# Patient Record
Sex: Female | Born: 1965 | Race: Black or African American | Hispanic: No | Smoking: Former smoker
Health system: Southern US, Community
[De-identification: ages and names within clinical notes are randomized; demographics above are authoritative.]

## PROBLEM LIST (undated history)

## (undated) DIAGNOSIS — C801 Malignant (primary) neoplasm, unspecified: Secondary | ICD-10-CM

## (undated) DIAGNOSIS — D649 Anemia, unspecified: Secondary | ICD-10-CM

## (undated) DIAGNOSIS — R51 Headache: Secondary | ICD-10-CM

## (undated) DIAGNOSIS — R1900 Intra-abdominal and pelvic swelling, mass and lump, unspecified site: Secondary | ICD-10-CM

## (undated) HISTORY — DX: Intra-abdominal and pelvic swelling, mass and lump, unspecified site: R19.00

---

## 1996-08-13 HISTORY — PX: ABDOMINAL HYSTERECTOMY: SHX81

## 2001-03-25 ENCOUNTER — Emergency Department (HOSPITAL_COMMUNITY): Admission: EM | Admit: 2001-03-25 | Discharge: 2001-03-26 | Payer: Self-pay | Admitting: Emergency Medicine

## 2001-11-25 ENCOUNTER — Emergency Department (HOSPITAL_COMMUNITY): Admission: EM | Admit: 2001-11-25 | Discharge: 2001-11-25 | Payer: Self-pay | Admitting: Emergency Medicine

## 2006-05-28 ENCOUNTER — Emergency Department (HOSPITAL_COMMUNITY): Admission: EM | Admit: 2006-05-28 | Discharge: 2006-05-28 | Payer: Self-pay | Admitting: Emergency Medicine

## 2008-09-24 ENCOUNTER — Emergency Department (HOSPITAL_COMMUNITY): Admission: EM | Admit: 2008-09-24 | Discharge: 2008-09-24 | Payer: Self-pay | Admitting: Emergency Medicine

## 2011-01-28 ENCOUNTER — Emergency Department (HOSPITAL_COMMUNITY)
Admission: EM | Admit: 2011-01-28 | Discharge: 2011-01-28 | Disposition: A | Discharge status: Home / Self Care | Payer: Self-pay | Attending: Emergency Medicine | Admitting: Emergency Medicine

## 2011-01-28 DIAGNOSIS — D573 Sickle-cell trait: Secondary | ICD-10-CM | POA: Insufficient documentation

## 2011-01-28 DIAGNOSIS — H538 Other visual disturbances: Secondary | ICD-10-CM | POA: Insufficient documentation

## 2011-01-28 DIAGNOSIS — T1590XA Foreign body on external eye, part unspecified, unspecified eye, initial encounter: Secondary | ICD-10-CM | POA: Insufficient documentation

## 2011-01-28 DIAGNOSIS — H5710 Ocular pain, unspecified eye: Secondary | ICD-10-CM | POA: Insufficient documentation

## 2011-01-28 DIAGNOSIS — S058X9A Other injuries of unspecified eye and orbit, initial encounter: Secondary | ICD-10-CM | POA: Insufficient documentation

## 2012-11-16 ENCOUNTER — Emergency Department (HOSPITAL_COMMUNITY)
Admission: EM | Admit: 2012-11-16 | Discharge: 2012-11-16 | Disposition: A | Discharge status: Home / Self Care | Payer: Self-pay | Attending: Emergency Medicine | Admitting: Emergency Medicine

## 2012-11-16 ENCOUNTER — Encounter (HOSPITAL_COMMUNITY): Payer: Self-pay | Admitting: *Deleted

## 2012-11-16 ENCOUNTER — Emergency Department (HOSPITAL_COMMUNITY): Payer: Self-pay

## 2012-11-16 DIAGNOSIS — R110 Nausea: Secondary | ICD-10-CM | POA: Insufficient documentation

## 2012-11-16 DIAGNOSIS — N83209 Unspecified ovarian cyst, unspecified side: Secondary | ICD-10-CM | POA: Insufficient documentation

## 2012-11-16 DIAGNOSIS — R142 Eructation: Secondary | ICD-10-CM | POA: Insufficient documentation

## 2012-11-16 DIAGNOSIS — Z3202 Encounter for pregnancy test, result negative: Secondary | ICD-10-CM | POA: Insufficient documentation

## 2012-11-16 DIAGNOSIS — Z9889 Other specified postprocedural states: Secondary | ICD-10-CM | POA: Insufficient documentation

## 2012-11-16 DIAGNOSIS — R1084 Generalized abdominal pain: Secondary | ICD-10-CM | POA: Insufficient documentation

## 2012-11-16 DIAGNOSIS — R141 Gas pain: Secondary | ICD-10-CM | POA: Insufficient documentation

## 2012-11-16 DIAGNOSIS — R109 Unspecified abdominal pain: Secondary | ICD-10-CM

## 2012-11-16 DIAGNOSIS — Z862 Personal history of diseases of the blood and blood-forming organs and certain disorders involving the immune mechanism: Secondary | ICD-10-CM | POA: Insufficient documentation

## 2012-11-16 DIAGNOSIS — Z90710 Acquired absence of both cervix and uterus: Secondary | ICD-10-CM | POA: Insufficient documentation

## 2012-11-16 HISTORY — DX: Anemia, unspecified: D64.9

## 2012-11-16 LAB — CBC WITH DIFFERENTIAL/PLATELET
Basophils Absolute: 0 10*3/uL (ref 0.0–0.1)
Basophils Relative: 0 % (ref 0–1)
HCT: 40 % (ref 36.0–46.0)
Hemoglobin: 14.5 g/dL (ref 12.0–15.0)
Lymphocytes Relative: 21 % (ref 12–46)
Monocytes Absolute: 0.8 10*3/uL (ref 0.1–1.0)
Monocytes Relative: 9 % (ref 3–12)
Neutro Abs: 5.9 10*3/uL (ref 1.7–7.7)
Neutrophils Relative %: 69 % (ref 43–77)
WBC: 8.5 10*3/uL (ref 4.0–10.5)

## 2012-11-16 LAB — COMPREHENSIVE METABOLIC PANEL
AST: 22 U/L (ref 0–37)
Albumin: 3.4 g/dL — ABNORMAL LOW (ref 3.5–5.2)
Alkaline Phosphatase: 71 U/L (ref 39–117)
BUN: 13 mg/dL (ref 6–23)
CO2: 23 mEq/L (ref 19–32)
Chloride: 98 mEq/L (ref 96–112)
Creatinine, Ser: 0.68 mg/dL (ref 0.50–1.10)
GFR calc non Af Amer: >90 mL/min (ref >90)
Potassium: 3.8 mEq/L (ref 3.5–5.1)
Total Bilirubin: 0.6 mg/dL (ref 0.3–1.2)

## 2012-11-16 LAB — URINALYSIS, ROUTINE W REFLEX MICROSCOPIC
Glucose, UA: NEGATIVE mg/dL
Ketones, ur: >80 mg/dL — AB
Protein, ur: 30 mg/dL — AB
pH: 5.5 (ref 5.0–8.0)

## 2012-11-16 LAB — URINE MICROSCOPIC-ADD ON

## 2012-11-16 MED ORDER — OXYCODONE-ACETAMINOPHEN 5-325 MG PO TABS: 1.0000 | ORAL_TABLET | ORAL | Status: DC | PRN

## 2012-11-16 MED ORDER — OXYCODONE-ACETAMINOPHEN 5-325 MG PO TABS
1.0000 | ORAL_TABLET | Freq: Once | ORAL | Status: AC
  Administered 2012-11-16: 1 via ORAL
  Filled 2012-11-16: qty 1

## 2012-11-16 MED ORDER — ONDANSETRON HCL 4 MG/2ML IJ SOLN
4.0000 mg | Freq: Once | INTRAMUSCULAR | Status: AC
  Administered 2012-11-16: 4 mg via INTRAVENOUS
  Filled 2012-11-16: qty 2

## 2012-11-16 MED ORDER — SODIUM CHLORIDE 0.9 % IV SOLN
1000.0000 mL | Freq: Once | INTRAVENOUS | Status: AC
  Administered 2012-11-16: 1000 mL via INTRAVENOUS

## 2012-11-16 MED ORDER — SODIUM CHLORIDE 0.9 % IV SOLN
1000.0000 mL | INTRAVENOUS | Status: DC
  Administered 2012-11-16: 1000 mL via INTRAVENOUS

## 2012-11-16 MED ORDER — IOHEXOL 300 MG/ML  SOLN
100.0000 mL | Freq: Once | INTRAMUSCULAR | Status: AC | PRN
  Administered 2012-11-16: 100 mL via INTRAVENOUS

## 2012-11-16 MED ORDER — MORPHINE SULFATE 4 MG/ML IJ SOLN
4.0000 mg | Freq: Once | INTRAMUSCULAR | Status: AC
  Administered 2012-11-16: 4 mg via INTRAVENOUS
  Filled 2012-11-16: qty 1

## 2012-11-16 MED ORDER — PROMETHAZINE HCL 25 MG PO TABS: 25.0000 mg | ORAL_TABLET | Freq: Four times a day (QID) | ORAL | Status: DC | PRN

## 2012-11-16 MED ORDER — IOHEXOL 300 MG/ML  SOLN
50.0000 mL | Freq: Once | INTRAMUSCULAR | Status: AC | PRN
  Administered 2012-11-16: 50 mL via ORAL

## 2012-11-16 MED ORDER — CEPHALEXIN 500 MG PO CAPS: 500.0000 mg | ORAL_CAPSULE | Freq: Four times a day (QID) | ORAL | Status: DC

## 2012-11-16 MED ORDER — MORPHINE SULFATE 4 MG/ML IJ SOLN
6.0000 mg | Freq: Once | INTRAMUSCULAR | Status: AC
  Administered 2012-11-16: 6 mg via INTRAVENOUS
  Filled 2012-11-16: qty 2

## 2012-11-16 MED ORDER — CEPHALEXIN 250 MG PO CAPS
500.0000 mg | ORAL_CAPSULE | Freq: Once | ORAL | Status: AC
  Administered 2012-11-16: 500 mg via ORAL
  Filled 2012-11-16: qty 2

## 2012-11-16 MED ORDER — METRONIDAZOLE 500 MG PO TABS
2000.0000 mg | ORAL_TABLET | Freq: Once | ORAL | Status: AC
  Administered 2012-11-16: 2000 mg via ORAL
  Filled 2012-11-16: qty 4

## 2012-11-16 NOTE — ED Notes (Signed)
Pt knows we need a urine sample 

## 2012-11-16 NOTE — ED Provider Notes (Signed)
History     CSN: [id number]  Arrival date & time 11/16/12  1252   First MD Initiated Contact with Patient 11/16/12 1305      Chief Complaint  Patient presents with  . Abdominal Pain     HPI Patient reports developing generalized abdominal pain with some abdominal swelling over the past several days.  She's had nausea without vomiting.  Initially she had diarrhea but that is resolved.  She's still moving flatus.  She has a history of C-sections before in the past.  No other abdominal surgery.  No history of bowel obstruction.  No fevers or chills.  No melena or hematochezia.  Her pain is mild to moderate at this generalized.  Decreased oral intake over the past 24 hours.  She's had a partial hysterectomy but still has her ovaries.  No recent weight change or weight gain.  No night sweats.  No pain with urination or frequency of urination.  No rash.  No other complaints the   Past Medical History  Diagnosis Date  . Anemia     Past Surgical History  Procedure Laterality Date  . Abdominal hysterectomy      History reviewed. No pertinent family history.  History  Substance Use Topics  . Smoking status: Not on file  . Smokeless tobacco: Not on file  . Alcohol Use: No    OB History   Grav Para Term Preterm Abortions TAB SAB Ect Mult Living                  Review of Systems  All other systems reviewed and are negative.    Allergies  Review of patient's allergies indicates not on file.  Home Medications  No current outpatient prescriptions on file.  BP 119/53  Pulse 88  Temp(Src) 98.7 F (37.1 C) (Oral)  Resp 16  SpO2 100%  Physical Exam  Nursing note and vitals reviewed. Constitutional: She is oriented to person, place, and time. She appears well-developed and well-nourished. No distress.  HENT:  Head: Normocephalic and atraumatic.  Eyes: EOM are normal.  Neck: Normal range of motion.  Cardiovascular: Normal rate, regular rhythm and normal heart sounds.    Pulmonary/Chest: Effort normal and breath sounds normal.  Abdominal: Soft. She exhibits distension. There is no tenderness. There is no rebound and no guarding.  No masses palpated  Musculoskeletal: Normal range of motion.  Neurological: She is alert and oriented to person, place, and time.  Skin: Skin is warm and dry.  Psychiatric: She has a normal mood and affect. Judgment normal.    ED Course  Procedures (including critical care time)  Labs Reviewed  CBC WITH DIFFERENTIAL - Abnormal; Notable for the following:    MCHC 36.3 (*)    All other components within normal limits  COMPREHENSIVE METABOLIC PANEL - Abnormal; Notable for the following:    Sodium 134 (*)    Total Protein 8.4 (*)    Albumin 3.4 (*)    All other components within normal limits  URINALYSIS, ROUTINE W REFLEX MICROSCOPIC - Abnormal; Notable for the following:    Color, Urine AMBER (*)    APPearance CLOUDY (*)    Hgb urine dipstick LARGE (*)    Bilirubin Urine SMALL (*)    Ketones, ur >80 (*)    Protein, ur 30 (*)    Nitrite POSITIVE (*)    Leukocytes, UA SMALL (*)    All other components within normal limits  URINE MICROSCOPIC-ADD ON - Abnormal; Notable  for the following:    Squamous Epithelial / LPF FEW (*)    Bacteria, UA MANY (*)    All other components within normal limits  URINE CULTURE  LIPASE, BLOOD  POCT PREGNANCY, URINE   Ct Abdomen Pelvis W Contrast  11/16/2012  *RADIOLOGY REPORT*  Clinical Data: Abdominal pain, nausea, distention and diarrhea.  CT ABDOMEN AND PELVIS WITH CONTRAST  Technique:  Multidetector CT imaging of the abdomen and pelvis was performed following the standard protocol during bolus administration of intravenous contrast.  Contrast: OMNIPAQUE IOHEXOL 300 MG/ML SOLN  Comparison: None.  Findings: There is a large complex cystic lesion of the left pelvis measuring approximately 7.2 x 10.5 x 9.3 cm.  This contains a single dominant anterior and superior septation.  Internal  fluid density is 21 HU and consistent with complex fluid.  The lesion appears largely cystic without a significant solid enhancing component.  Gynecologic referral is recommended as this is highly suspicious for a cystic ovarian neoplasm.  Correlation also suggested with previous gynecologic history as it appears that the uterus has been previously removed.  No ascites or evidence of overt carcinomatosis.  Bowel loops are normal caliber.  The liver, gallbladder, pancreas, spleen, adrenal glands and kidneys are unremarkable.  No bony abnormalities is seen.  IMPRESSION: Large complex cystic lesion of the left pelvis which likely represents a cystic ovarian process.  This would have to be considered malignant until proven otherwise.  Gynecologic referral is recommended.   Original Report Authenticated By: Irish Lack, M.D.    I personally reviewed the imaging tests through PACS system I reviewed available ER/hospitalization records through the EMR   1. Abdominal pain   2. Ovarian cystic mass       MDM  Patient's CT scan is concerning for a large ovarian cyst/ovarian cystic mass.  This is concerning for ovarian cancer.  The patient understands the importance of gynecologic followup.  She was personally shown the images in her room.  [facility] for followup.  She understands the potential for cancer.  Discharge home in good condition.  No evidence of bowel obstruction or other abnormalities.  She understands to return to the ER for new or worsening symptoms.  Likely urinary tract infection.  This is true of Keflex.  Home with Keflex.  Also with Trichomonas.  2 g of Flagyl here.  She understands importance of having her partner seen and evaluated and treated at the health department or with his primary care physician       Lyanne Co, MD 11/16/12 [phone]

## 2012-11-16 NOTE — ED Notes (Signed)
Pt reports onset Friday evening of generalized abd pain and distention. Having nausea. Reports initially having diarrhea but it has stopped.

## 2012-11-18 LAB — URINE CULTURE

## 2012-11-19 ENCOUNTER — Telehealth (HOSPITAL_COMMUNITY): Payer: Self-pay | Admitting: Emergency Medicine

## 2012-11-19 NOTE — ED Notes (Signed)
+  Urine. Patient treated with Keflex. Sensitive to same. Per protocol MD. °

## 2012-11-19 NOTE — ED Notes (Signed)
Patient has +Urine culture. °

## 2012-12-10 ENCOUNTER — Ambulatory Visit: Payer: PRIVATE HEALTH INSURANCE | Attending: Gynecologic Oncology | Admitting: Gynecologic Oncology

## 2012-12-16 ENCOUNTER — Ambulatory Visit: Payer: PRIVATE HEALTH INSURANCE | Attending: Gynecology | Admitting: Gynecology

## 2012-12-16 ENCOUNTER — Encounter: Payer: Self-pay | Admitting: Gynecology

## 2012-12-16 VITALS — Temp 98.7°F | Ht 62.0 in | Wt 132.7 lb

## 2012-12-16 DIAGNOSIS — M25552 Pain in left hip: Secondary | ICD-10-CM

## 2012-12-16 DIAGNOSIS — R1900 Intra-abdominal and pelvic swelling, mass and lump, unspecified site: Secondary | ICD-10-CM

## 2012-12-16 DIAGNOSIS — M25559 Pain in unspecified hip: Secondary | ICD-10-CM | POA: Insufficient documentation

## 2012-12-16 DIAGNOSIS — N83209 Unspecified ovarian cyst, unspecified side: Secondary | ICD-10-CM | POA: Insufficient documentation

## 2012-12-16 NOTE — Patient Instructions (Signed)
We will schedule surgery to remove the left ovarian cyst on 01/13/2013. Preoperative evaluation be scheduled prior to that time.

## 2012-12-16 NOTE — Progress Notes (Signed)
Consult Note: Gyn-Onc   Susan Osborn 47 y.o. female  Chief Complaint  Patient presents with  . Pelvic Mass    New patient    Assessment : Complex left ovarian cyst with associated pain.  Plan: I recommend the patient undergo exploratory laparotomy through her prior Pfannenstiel incision. A left salpingo-oophorectomy will be performed and submitted for frozen section. If this is benign and the right ovary appears normal we will leave the right ovary intact. On the other hand, if the patient does have ovarian cancer, we will proceed with full surgical staging including lymphadenectomy omentectomy and bilateral salpingo-oophorectomy.  Risks surgery discussed the patient's questions are answered. Surgery will be scheduled for 01/14/2012.  Patient given a prescription for Vicodin for pain. Preoperative evaluation will be scheduled.     HPI: 47 year old African American female seen in consultation of Dr.[name] regarding management of a newly diagnosed pelvic mass. The patient presented to the emergency room on 11/16/2012 with pain. A CT scan was obtained showing a 7.2 x 10.5 x 9.3 cm large complex left ovarian mass. No evidence of adenopathy or ascites or peritoneal thickening.  Patient continued to have pain but is extreme. She denies any other GI or GU symptoms.  Obstetrical history: Gravida 3. All deliveries were by C-section.  Patient's had no other gynecologic history. She denies any abnormal Pap smears.  She did have a hysterectomy in 1999 for abnormal uterine bleeding.  Review of Systems:10 point review of systems is negative except as noted in interval history.   Vitals: Temperature 98.7 F (37.1 C), height 5\' 2"  (1.575 m), weight 132 lb 11.2 oz (60.192 kg).  Physical Exam: General : The patient is a healthy woman in no acute distress.  HEENT: normocephalic, extraoccular movements normal; neck is supple without thyromegally  Lynphnodes: Supraclavicular and  inguinal nodes not enlarged  Abdomen: Soft, non-tender, no ascites, no organomegally, no masses, no hernias, well-healed Pfannenstiel incision  Pelvic:  EGBUS: Normal female  Vagina: Normal, no lesions  Urethra and Bladder: Normal, non-tender  Cervix: Surgically absent  Uterus: Surgically absent  Bi-manual examination: Non-tender; there is fullness in the left adnexa. This is nontender. Rectal: normal sphincter tone, no masses, no blood  Lower extremities: No edema or varicosities. Normal range of motion      No Known Allergies  Past Medical History  Diagnosis Date  . Anemia   . Pelvic mass in female     Past Surgical History  Procedure Laterality Date  . Cesarean section      x3  . Abdominal hysterectomy  1998    partial    Current Outpatient Prescriptions  Medication Sig Dispense Refill  . Multiple Vitamin (MULTIVITAMIN WITH MINERALS) TABS Take 1 tablet by mouth daily.      Marland Kitchen oxyCODONE-acetaminophen (PERCOCET/ROXICET) 5-325 MG per tablet Take 1 tablet by mouth every 4 (four) hours as needed for pain.  20 tablet  0  . cephALEXin (KEFLEX) 500 MG capsule Take 1 capsule (500 mg total) by mouth 4 (four) times daily.  28 capsule  0  . promethazine (PHENERGAN) 25 MG tablet Take 1 tablet (25 mg total) by mouth every 6 (six) hours as needed for nausea.  15 tablet  0   No current facility-administered medications for this visit.    History   Social History  . Marital Status: Legally Separated    Spouse Name: N/A    Number of Children: N/A  . Years of Education: N/A   Occupational History  .  Not on file.   Social History Main Topics  . Smoking status: Former Smoker    Quit date: 12/12/2010  . Smokeless tobacco: Never Used  . Alcohol Use: Yes     Comment: occas  . Drug Use: No  . Sexually Active: No   Other Topics Concern  . Not on file   Social History Narrative  . No narrative on file    Family History  Problem Relation Age of Onset  . Breast cancer  Mother   . Stomach cancer Mother   . Emphysema Father   . Kidney failure Brother   . Pancreatic cancer Maternal Aunt   . Stomach cancer Paternal Grandfather       Jeannette Corpus, MD 12/16/2012, 2:02 PM

## 2013-01-01 ENCOUNTER — Encounter (HOSPITAL_COMMUNITY): Payer: Self-pay | Admitting: Pharmacy Technician

## 2013-01-08 NOTE — Patient Instructions (Signed)
Susan Osborn  01/08/2013   Your procedure is scheduled on:  01/13/13    Report to [facility] at    1015  AM.  Call this number if you have problems the morning of surgery: [phone]   Remember:   Do not eat food after midnite May have clear liquids until 0600am then npo.    Take these medicines the morning of surgery with A SIP OF WATER:    Do not wear jewelry, make-up or nail polish.  Do not wear lotions, powders, or perfumes. .  Do not shave 48 hours prior to surgery.   Do not bring valuables to the hospital.  Contacts, dentures or bridgework may not be worn into surgery.  Leave suitcase in the car. After surgery it may be brought to your room.  For patients admitted to the hospital, checkout time is 11:00 AM the day of  discharge.    SEE CHG INSTRUCTION SHEET    Please read over the following fact sheets that you were given: MRSA Information, coughing and deep breathing exercises, leg exercises, Blood Transfusion Fact sheet, Incentive Spirometry Fact sheet                Failure to comply with these instructions may result in cancellation of your surgery.                Patient Signature ____________________________              Nurse Signature _____________________________

## 2013-01-09 ENCOUNTER — Encounter (HOSPITAL_COMMUNITY)
Admission: RE | Admit: 2013-01-09 | Discharge: 2013-01-09 | Disposition: A | Discharge status: Home / Self Care | Payer: PRIVATE HEALTH INSURANCE | Source: Ambulatory Visit | Attending: Gynecology | Admitting: Gynecology

## 2013-01-09 ENCOUNTER — Encounter (HOSPITAL_COMMUNITY): Payer: Self-pay

## 2013-01-09 DIAGNOSIS — Z0183 Encounter for blood typing: Secondary | ICD-10-CM | POA: Insufficient documentation

## 2013-01-09 DIAGNOSIS — Z01812 Encounter for preprocedural laboratory examination: Secondary | ICD-10-CM | POA: Insufficient documentation

## 2013-01-09 HISTORY — DX: Headache: R51

## 2013-01-09 LAB — COMPREHENSIVE METABOLIC PANEL
Albumin: 3.4 g/dL — ABNORMAL LOW (ref 3.5–5.2)
Alkaline Phosphatase: 62 U/L (ref 39–117)
BUN: 12 mg/dL (ref 6–23)
CO2: 30 mEq/L (ref 19–32)
Chloride: 102 mEq/L (ref 96–112)
Creatinine, Ser: 0.73 mg/dL (ref 0.50–1.10)
GFR calc Af Amer: >90 mL/min (ref >90)
GFR calc non Af Amer: >90 mL/min (ref >90)
Glucose, Bld: 82 mg/dL (ref 70–99)
Potassium: 4.4 mEq/L (ref 3.5–5.1)
Total Bilirubin: 0.2 mg/dL — ABNORMAL LOW (ref 0.3–1.2)

## 2013-01-09 LAB — CBC WITH DIFFERENTIAL/PLATELET
Basophils Relative: 0 % (ref 0–1)
HCT: 40.5 % (ref 36.0–46.0)
Hemoglobin: 13.7 g/dL (ref 12.0–15.0)
Lymphs Abs: 1.6 10*3/uL (ref 0.7–4.0)
MCH: 31.4 pg (ref 26.0–34.0)
MCHC: 33.8 g/dL (ref 30.0–36.0)
Monocytes Absolute: 0.3 10*3/uL (ref 0.1–1.0)
Monocytes Relative: 6 % (ref 3–12)
Neutro Abs: 2.3 10*3/uL (ref 1.7–7.7)
Neutrophils Relative %: 55 % (ref 43–77)
RBC: 4.36 MIL/uL (ref 3.87–5.11)

## 2013-01-09 LAB — SURGICAL PCR SCREEN: Staphylococcus aureus: NEGATIVE

## 2013-01-09 NOTE — Progress Notes (Signed)
Patient seen by Karena Addison in Pathology Research then patient to Oncology GYN office regarding bowel prep.  Melissa in office aware patient was coming by office regarding bowel prep after preop appointment.

## 2013-01-12 ENCOUNTER — Telehealth: Payer: Self-pay | Admitting: Gynecologic Oncology

## 2013-01-12 NOTE — Telephone Encounter (Signed)
Called to check on patient's pre-op status.  No answer and unable to leave a voicemail message.

## 2013-01-12 NOTE — Telephone Encounter (Signed)
Telephone call to check on pre-operative status.  No answer and unable to leave message since mailbox was full.  Spoke with her emergency contact, Howell Pringle, and she stated that her mother lost her phone and there was no other number to contact her at.  Stating that her mother understood the pre-op instructions and had began her bowel prep earlier.  Instructed to call the office with any questions or concerns.

## 2013-01-13 ENCOUNTER — Inpatient Hospital Stay (HOSPITAL_COMMUNITY)
Admission: RE | Admit: 2013-01-13 | Discharge: 2013-01-17 | DRG: 742 | Disposition: A | Discharge status: Home / Self Care | Payer: PRIVATE HEALTH INSURANCE | Source: Ambulatory Visit | Attending: Obstetrics & Gynecology | Admitting: Obstetrics & Gynecology

## 2013-01-13 ENCOUNTER — Encounter (HOSPITAL_COMMUNITY): Payer: Self-pay | Admitting: Anesthesiology

## 2013-01-13 ENCOUNTER — Encounter (HOSPITAL_COMMUNITY)
Admission: RE | Disposition: A | Discharge status: Home / Self Care | Payer: Self-pay | Source: Ambulatory Visit | Attending: Obstetrics & Gynecology

## 2013-01-13 ENCOUNTER — Encounter (HOSPITAL_COMMUNITY): Payer: Self-pay | Admitting: *Deleted

## 2013-01-13 ENCOUNTER — Ambulatory Visit (HOSPITAL_COMMUNITY): Payer: PRIVATE HEALTH INSURANCE | Admitting: Anesthesiology

## 2013-01-13 DIAGNOSIS — N80129 Deep endometriosis of ovary, unspecified ovary: Secondary | ICD-10-CM | POA: Diagnosis present

## 2013-01-13 DIAGNOSIS — N736 Female pelvic peritoneal adhesions (postinfective): Secondary | ICD-10-CM | POA: Diagnosis present

## 2013-01-13 DIAGNOSIS — D62 Acute posthemorrhagic anemia: Secondary | ICD-10-CM | POA: Diagnosis not present

## 2013-01-13 DIAGNOSIS — N801 Endometriosis of ovary: Secondary | ICD-10-CM | POA: Diagnosis present

## 2013-01-13 DIAGNOSIS — N135 Crossing vessel and stricture of ureter without hydronephrosis: Secondary | ICD-10-CM | POA: Diagnosis present

## 2013-01-13 DIAGNOSIS — N83209 Unspecified ovarian cyst, unspecified side: Principal | ICD-10-CM

## 2013-01-13 DIAGNOSIS — Z87891 Personal history of nicotine dependence: Secondary | ICD-10-CM

## 2013-01-13 DIAGNOSIS — N80109 Endometriosis of ovary, unspecified side, unspecified depth: Secondary | ICD-10-CM | POA: Diagnosis present

## 2013-01-13 DIAGNOSIS — D201 Benign neoplasm of soft tissue of peritoneum: Secondary | ICD-10-CM

## 2013-01-13 DIAGNOSIS — D200 Benign neoplasm of soft tissue of retroperitoneum: Secondary | ICD-10-CM

## 2013-01-13 HISTORY — PX: LAPAROTOMY: SHX154

## 2013-01-13 LAB — TYPE AND SCREEN
ABO/RH(D): O POS
Antibody Screen: NEGATIVE

## 2013-01-13 SURGERY — LAPAROTOMY, EXPLORATORY
Anesthesia: General | Laterality: Left | Wound class: Clean Contaminated

## 2013-01-13 MED ORDER — 0.9 % SODIUM CHLORIDE (POUR BTL) OPTIME
TOPICAL | Status: DC | PRN
  Administered 2013-01-13: 2000 mL

## 2013-01-13 MED ORDER — MIDAZOLAM HCL 5 MG/5ML IJ SOLN
INTRAMUSCULAR | Status: DC | PRN
  Administered 2013-01-13: 2 mg via INTRAVENOUS

## 2013-01-13 MED ORDER — HYDROMORPHONE HCL PF 1 MG/ML IJ SOLN
INTRAMUSCULAR | Status: AC
  Filled 2013-01-13: qty 2

## 2013-01-13 MED ORDER — SODIUM CHLORIDE 0.9 % IJ SOLN
INTRAMUSCULAR | Status: DC | PRN
  Administered 2013-01-13: 13:00:00

## 2013-01-13 MED ORDER — ONDANSETRON HCL 4 MG/2ML IJ SOLN
4.0000 mg | Freq: Four times a day (QID) | INTRAMUSCULAR | Status: DC | PRN
  Administered 2013-01-13 – 2013-01-15 (×4): 4 mg via INTRAVENOUS
  Filled 2013-01-13 (×6): qty 2

## 2013-01-13 MED ORDER — CEFAZOLIN SODIUM-DEXTROSE 2-3 GM-% IV SOLR
2.0000 g | INTRAVENOUS | Status: AC
  Administered 2013-01-13: 2 g via INTRAVENOUS

## 2013-01-13 MED ORDER — FENTANYL CITRATE 0.05 MG/ML IJ SOLN
25.0000 ug | INTRAMUSCULAR | Status: DC | PRN
  Administered 2013-01-13 (×3): 50 ug via INTRAVENOUS

## 2013-01-13 MED ORDER — LACTATED RINGERS IV SOLN: INTRAVENOUS | Status: DC

## 2013-01-13 MED ORDER — KCL IN DEXTROSE-NACL 20-5-0.45 MEQ/L-%-% IV SOLN
INTRAVENOUS | Status: DC
  Administered 2013-01-13 – 2013-01-14 (×3): via INTRAVENOUS
  Filled 2013-01-13 (×3): qty 1000

## 2013-01-13 MED ORDER — ROCURONIUM BROMIDE 100 MG/10ML IV SOLN
INTRAVENOUS | Status: DC | PRN
  Administered 2013-01-13: 10 mg via INTRAVENOUS
  Administered 2013-01-13: 40 mg via INTRAVENOUS
  Administered 2013-01-13: 10 mg via INTRAVENOUS

## 2013-01-13 MED ORDER — FENTANYL CITRATE 0.05 MG/ML IJ SOLN
INTRAMUSCULAR | Status: AC
  Filled 2013-01-13: qty 2

## 2013-01-13 MED ORDER — HYDROMORPHONE HCL PF 1 MG/ML IJ SOLN: INTRAMUSCULAR | Status: DC | PRN

## 2013-01-13 MED ORDER — KETOROLAC TROMETHAMINE 30 MG/ML IJ SOLN
INTRAMUSCULAR | Status: AC
  Filled 2013-01-13: qty 1

## 2013-01-13 MED ORDER — ONDANSETRON HCL 4 MG PO TABS: 4.0000 mg | ORAL_TABLET | Freq: Four times a day (QID) | ORAL | Status: DC | PRN

## 2013-01-13 MED ORDER — OXYCODONE-ACETAMINOPHEN 5-325 MG PO TABS
1.0000 | ORAL_TABLET | ORAL | Status: DC | PRN
  Administered 2013-01-13: 1 via ORAL
  Administered 2013-01-14 – 2013-01-17 (×7): 2 via ORAL
  Filled 2013-01-13 (×9): qty 2
  Filled 2013-01-13: qty 1

## 2013-01-13 MED ORDER — KETOROLAC TROMETHAMINE 30 MG/ML IJ SOLN
30.0000 mg | Freq: Four times a day (QID) | INTRAMUSCULAR | Status: DC
  Administered 2013-01-13 – 2013-01-17 (×16): 30 mg via INTRAVENOUS
  Filled 2013-01-13 (×22): qty 1

## 2013-01-13 MED ORDER — HYDROMORPHONE HCL PF 1 MG/ML IJ SOLN
0.5000 mg | INTRAMUSCULAR | Status: DC | PRN
  Administered 2013-01-13 – 2013-01-14 (×6): 0.5 mg via INTRAVENOUS
  Filled 2013-01-13 (×6): qty 1

## 2013-01-13 MED ORDER — HYDROMORPHONE HCL PF 1 MG/ML IJ SOLN
INTRAMUSCULAR | Status: AC
  Filled 2013-01-13: qty 1

## 2013-01-13 MED ORDER — LACTATED RINGERS IV SOLN
INTRAVENOUS | Status: DC | PRN
  Administered 2013-01-13: 16:00:00
  Administered 2013-01-13 (×2): via INTRAVENOUS

## 2013-01-13 MED ORDER — PROPOFOL 10 MG/ML IV BOLUS
INTRAVENOUS | Status: DC | PRN
  Administered 2013-01-13: 150 mg via INTRAVENOUS

## 2013-01-13 MED ORDER — HYDROMORPHONE HCL PF 1 MG/ML IJ SOLN
INTRAMUSCULAR | Status: DC | PRN
  Administered 2013-01-13: 1 mg via INTRAVENOUS
  Administered 2013-01-13 (×2): 0.5 mg via INTRAVENOUS

## 2013-01-13 MED ORDER — KETOROLAC TROMETHAMINE 30 MG/ML IJ SOLN
30.0000 mg | Freq: Four times a day (QID) | INTRAMUSCULAR | Status: DC
  Filled 2013-01-13 (×16): qty 1

## 2013-01-13 MED ORDER — HYDROMORPHONE HCL PF 1 MG/ML IJ SOLN: 0.5000 mg | INTRAMUSCULAR | Status: DC | PRN

## 2013-01-13 MED ORDER — GLYCOPYRROLATE 0.2 MG/ML IJ SOLN
INTRAMUSCULAR | Status: DC | PRN
  Administered 2013-01-13: 0.4 mg via INTRAVENOUS

## 2013-01-13 MED ORDER — CEFAZOLIN SODIUM-DEXTROSE 2-3 GM-% IV SOLR
INTRAVENOUS | Status: AC
  Filled 2013-01-13: qty 50

## 2013-01-13 MED ORDER — BUPIVACAINE LIPOSOME 1.3 % IJ SUSP
20.0000 mL | Freq: Once | INTRAMUSCULAR | Status: DC
  Filled 2013-01-13: qty 20

## 2013-01-13 MED ORDER — ONDANSETRON HCL 4 MG/2ML IJ SOLN
INTRAMUSCULAR | Status: DC | PRN
  Administered 2013-01-13: 4 mg via INTRAVENOUS

## 2013-01-13 MED ORDER — LIDOCAINE HCL (CARDIAC) 20 MG/ML IV SOLN
INTRAVENOUS | Status: DC | PRN
  Administered 2013-01-13: 40 mg via INTRAVENOUS
  Administered 2013-01-13: 60 mg via INTRAVENOUS

## 2013-01-13 MED ORDER — HYDROMORPHONE HCL PF 1 MG/ML IJ SOLN
0.2500 mg | INTRAMUSCULAR | Status: DC | PRN
  Administered 2013-01-13 (×7): 0.5 mg via INTRAVENOUS

## 2013-01-13 MED ORDER — FENTANYL CITRATE 0.05 MG/ML IJ SOLN: 25.0000 ug | INTRAMUSCULAR | Status: DC | PRN

## 2013-01-13 MED ORDER — FENTANYL CITRATE 0.05 MG/ML IJ SOLN
INTRAMUSCULAR | Status: DC | PRN
  Administered 2013-01-13 (×2): 100 ug via INTRAVENOUS
  Administered 2013-01-13: 50 ug via INTRAVENOUS

## 2013-01-13 MED ORDER — MAGNESIUM HYDROXIDE 400 MG/5ML PO SUSP
30.0000 mL | Freq: Three times a day (TID) | ORAL | Status: AC
  Administered 2013-01-13 – 2013-01-14 (×3): 30 mL via ORAL
  Filled 2013-01-13 (×3): qty 30

## 2013-01-13 MED ORDER — NEOSTIGMINE METHYLSULFATE 1 MG/ML IJ SOLN
INTRAMUSCULAR | Status: DC | PRN
  Administered 2013-01-13: 3 mg via INTRAVENOUS

## 2013-01-13 MED ORDER — ZOLPIDEM TARTRATE 5 MG PO TABS: 5.0000 mg | ORAL_TABLET | Freq: Every evening | ORAL | Status: DC | PRN

## 2013-01-13 MED ORDER — PROMETHAZINE HCL 25 MG/ML IJ SOLN: 6.2500 mg | INTRAMUSCULAR | Status: DC | PRN

## 2013-01-13 SURGICAL SUPPLY — 46 items
ATTRACTOMAT 16X20 MAGNETIC DRP (DRAPES) ×2 IMPLANT
BAG URINE DRAINAGE (UROLOGICAL SUPPLIES) ×1 IMPLANT
CANISTER SUCTION 2500CC (MISCELLANEOUS) ×1 IMPLANT
CHLORAPREP W/TINT 26ML (MISCELLANEOUS) ×2 IMPLANT
CLIP TI MEDIUM LARGE 6 (CLIP) ×6 IMPLANT
CLOTH BEACON ORANGE TIMEOUT ST (SAFETY) ×2 IMPLANT
COVER SURGICAL LIGHT HANDLE (MISCELLANEOUS) ×2 IMPLANT
DRAPE UTILITY XL STRL (DRAPES) ×2 IMPLANT
DRAPE WARM FLUID 44X44 (DRAPE) ×2 IMPLANT
DRSG TELFA 4X14 ISLAND ADH (GAUZE/BANDAGES/DRESSINGS) ×1 IMPLANT
ELECT BLADE 6.5 EXT (BLADE) ×2 IMPLANT
ELECT REM PT RETURN 9FT ADLT (ELECTROSURGICAL) ×2
ELECTRODE REM PT RTRN 9FT ADLT (ELECTROSURGICAL) ×1 IMPLANT
GAUZE SPONGE 4X4 16PLY XRAY LF (GAUZE/BANDAGES/DRESSINGS) ×1 IMPLANT
GLOVE BIOGEL M STRL SZ7.5 (GLOVE) ×9 IMPLANT
GOWN PREVENTION PLUS LG XLONG (DISPOSABLE) ×1 IMPLANT
GOWN STRL NON-REIN LRG LVL3 (GOWN DISPOSABLE) ×3 IMPLANT
GOWN STRL REIN XL XLG (GOWN DISPOSABLE) ×3 IMPLANT
LIGASURE IMPACT 36 18CM CVD LR (INSTRUMENTS) IMPLANT
MANIFOLD NEPTUNE II (INSTRUMENTS) ×1 IMPLANT
NS IRRIG 1000ML POUR BTL (IV SOLUTION) ×4 IMPLANT
PACK ABDOMINAL WL (CUSTOM PROCEDURE TRAY) ×2 IMPLANT
SET CYSTO W/LG BORE CLAMP LF (SET/KITS/TRAYS/PACK) ×1 IMPLANT
SHEET LAVH (DRAPES) ×2 IMPLANT
SPONGE GAUZE 4X4 12PLY (GAUZE/BANDAGES/DRESSINGS) ×2 IMPLANT
SPONGE LAP 18X18 X RAY DECT (DISPOSABLE) ×2 IMPLANT
STAPLER SKIN PROX WIDE 3.9 (STAPLE) ×2 IMPLANT
STAPLER VISISTAT 35W (STAPLE) ×1 IMPLANT
SUT ETHILON 1 LR 30 (SUTURE) IMPLANT
SUT PDS AB 1 CTXB1 36 (SUTURE) ×5 IMPLANT
SUT SILK 2 0 (SUTURE)
SUT SILK 2 0 30  PSL (SUTURE)
SUT SILK 2 0 30 PSL (SUTURE) IMPLANT
SUT SILK 2-0 18XBRD TIE 12 (SUTURE) ×1 IMPLANT
SUT VIC AB 0 CT1 36 (SUTURE) ×4 IMPLANT
SUT VIC AB 2-0 CT2 27 (SUTURE) ×8 IMPLANT
SUT VIC AB 2-0 SH 27 (SUTURE) ×4
SUT VIC AB 2-0 SH 27X BRD (SUTURE) ×6 IMPLANT
SUT VIC AB 3-0 CTX 36 (SUTURE) IMPLANT
SUT VICRYL 2 0 18  UND BR (SUTURE) ×1
SUT VICRYL 2 0 18 UND BR (SUTURE) ×1 IMPLANT
TAPE CLOTH SURG 4X10 WHT LF (GAUZE/BANDAGES/DRESSINGS) ×1 IMPLANT
TOWEL OR 17X26 10 PK STRL BLUE (TOWEL DISPOSABLE) ×2 IMPLANT
TOWEL OR NON WOVEN STRL DISP B (DISPOSABLE) ×2 IMPLANT
TRAY FOLEY CATH 14FRSI W/METER (CATHETERS) ×2 IMPLANT
WATER STERILE IRR 1500ML POUR (IV SOLUTION) ×1 IMPLANT

## 2013-01-13 NOTE — Interval H&P Note (Signed)
History and Physical Interval Note:  01/13/2013 11:44 AM  Susan Osborn  has presented today for surgery, with the diagnosis of PELVIC MASS   The various methods of treatment have been discussed with the patient and family. After consideration of risks, benefits and other options for treatment, the patient has consented to  Procedure(s): EXPLORATORY LAPAROTOMY LEFT SALPINGO OOPHORECTOMY POSSIBLE BILATERAL SALPINGO OOPHORECTOMY AND STAGING   (Left) as a surgical intervention .  The patient's history has been reviewed, patient examined, no change in status, stable for surgery.  I have reviewed the patient's chart and labs.  Questions were answered to the patient's satisfaction.     CLARKE-PEARSON,Devaun Hernandez L

## 2013-01-13 NOTE — H&P (View-Only) (Signed)
Consult Note: Gyn-Onc   Susan Osborn 47 y.o. female  Chief Complaint  Patient presents with  . Pelvic Mass    New patient    Assessment : Complex left ovarian cyst with associated pain.  Plan: I recommend the patient undergo exploratory laparotomy through her prior Pfannenstiel incision. A left salpingo-oophorectomy will be performed and submitted for frozen section. If this is benign and the right ovary appears normal we will leave the right ovary intact. On the other hand, if the patient does have ovarian cancer, we will proceed with full surgical staging including lymphadenectomy omentectomy and bilateral salpingo-oophorectomy.  Risks surgery discussed the patient's questions are answered. Surgery will be scheduled for 01/14/2012.  Patient given a prescription for Vicodin for pain. Preoperative evaluation will be scheduled.     HPI: 47-year-old African American female seen in consultation of Dr.[name] regarding management of a newly diagnosed pelvic mass. The patient presented to the emergency room on 11/16/2012 with pain. A CT scan was obtained showing a 7.2 x 10.5 x 9.3 cm large complex left ovarian mass. No evidence of adenopathy or ascites or peritoneal thickening.  Patient continued to have pain but is extreme. She denies any other GI or GU symptoms.  Obstetrical history: Gravida 3. All deliveries were by C-section.  Patient's had no other gynecologic history. She denies any abnormal Pap smears.  She did have a hysterectomy in 1999 for abnormal uterine bleeding.  Review of Systems:10 point review of systems is negative except as noted in interval history.   Vitals: Temperature 98.7 F (37.1 C), height 5' 2" (1.575 m), weight 132 lb 11.2 oz (60.192 kg).  Physical Exam: General : The patient is a healthy woman in no acute distress.  HEENT: normocephalic, extraoccular movements normal; neck is supple without thyromegally  Lynphnodes: Supraclavicular and  inguinal nodes not enlarged  Abdomen: Soft, non-tender, no ascites, no organomegally, no masses, no hernias, well-healed Pfannenstiel incision  Pelvic:  EGBUS: Normal female  Vagina: Normal, no lesions  Urethra and Bladder: Normal, non-tender  Cervix: Surgically absent  Uterus: Surgically absent  Bi-manual examination: Non-tender; there is fullness in the left adnexa. This is nontender. Rectal: normal sphincter tone, no masses, no blood  Lower extremities: No edema or varicosities. Normal range of motion      No Known Allergies  Past Medical History  Diagnosis Date  . Anemia   . Pelvic mass in female     Past Surgical History  Procedure Laterality Date  . Cesarean section      x3  . Abdominal hysterectomy  1998    partial    Current Outpatient Prescriptions  Medication Sig Dispense Refill  . Multiple Vitamin (MULTIVITAMIN WITH MINERALS) TABS Take 1 tablet by mouth daily.      . oxyCODONE-acetaminophen (PERCOCET/ROXICET) 5-325 MG per tablet Take 1 tablet by mouth every 4 (four) hours as needed for pain.  20 tablet  0  . cephALEXin (KEFLEX) 500 MG capsule Take 1 capsule (500 mg total) by mouth 4 (four) times daily.  28 capsule  0  . promethazine (PHENERGAN) 25 MG tablet Take 1 tablet (25 mg total) by mouth every 6 (six) hours as needed for nausea.  15 tablet  0   No current facility-administered medications for this visit.    History   Social History  . Marital Status: Legally Separated    Spouse Name: N/A    Number of Children: N/A  . Years of Education: N/A   Occupational History  .   Not on file.   Social History Main Topics  . Smoking status: Former Smoker    Quit date: 12/12/2010  . Smokeless tobacco: Never Used  . Alcohol Use: Yes     Comment: occas  . Drug Use: No  . Sexually Active: No   Other Topics Concern  . Not on file   Social History Narrative  . No narrative on file    Family History  Problem Relation Age of Onset  . Breast cancer  Mother   . Stomach cancer Mother   . Emphysema Father   . Kidney failure Brother   . Pancreatic cancer Maternal Aunt   . Stomach cancer Paternal Grandfather       CLARKE-PEARSON,Kastiel Simonian L, MD 12/16/2012, 2:02 PM        

## 2013-01-13 NOTE — Anesthesia Preprocedure Evaluation (Addendum)
Anesthesia Evaluation  Patient identified by MRN, date of birth, ID band Patient awake    Reviewed: Allergy & Precautions, H&P , NPO status , Patient's Chart, lab work & pertinent test results  Airway Mallampati: II TM Distance: >3 FB Neck ROM: Full    Dental no notable dental hx.    Pulmonary neg pulmonary ROS,  breath sounds clear to auscultation  Pulmonary exam normal       Cardiovascular Exercise Tolerance: Good negative cardio ROS  Rhythm:Regular Rate:Normal     Neuro/Psych  Headaches, negative psych ROS   GI/Hepatic negative GI ROS, Neg liver ROS,   Endo/Other  negative endocrine ROS  Renal/GU negative Renal ROS  negative genitourinary   Musculoskeletal negative musculoskeletal ROS (+)   Abdominal   Peds negative pediatric ROS (+)  Hematology negative hematology ROS (+)   Anesthesia Other Findings   Reproductive/Obstetrics negative OB ROS                           Anesthesia Physical Anesthesia Plan  ASA: II  Anesthesia Plan: General   Post-op Pain Management:    Induction: Intravenous  Airway Management Planned: Oral ETT  Additional Equipment:   Intra-op Plan:   Post-operative Plan: Extubation in OR  Informed Consent: I have reviewed the patients History and Physical, chart, labs and discussed the procedure including the risks, benefits and alternatives for the proposed anesthesia with the patient or authorized representative who has indicated his/her understanding and acceptance.   Dental advisory given  Plan Discussed with: CRNA  Anesthesia Plan Comments:         Anesthesia Quick Evaluation

## 2013-01-13 NOTE — Preoperative (Signed)
Beta Blockers   Reason not to administer Beta Blockers:Not Applicable 

## 2013-01-13 NOTE — Anesthesia Postprocedure Evaluation (Signed)
  Anesthesia Post-op Note  Patient: Susan Osborn  Procedure(s) Performed: Procedure(s) (LRB): EXPLORATORY LAPAROTOMY,BILATERAL SALPINGO OOPHORECTOMY, Left ureterolysis,Lysis of adhesions (Left)  Patient Location: PACU  Anesthesia Type: General  Level of Consciousness: awake and alert   Airway and Oxygen Therapy: Patient Spontanous Breathing  Post-op Pain: mild  Post-op Assessment: Post-op Vital signs reviewed, Patient's Cardiovascular Status Stable, Respiratory Function Stable, Patent Airway and No signs of Nausea or vomiting  Last Vitals:  Filed Vitals:   01/13/13 1715  BP: 123/55  Pulse: 62  Temp: 37.2 C  Resp: 18    Post-op Vital Signs: stable   Complications: No apparent anesthesia complications

## 2013-01-13 NOTE — Transfer of Care (Signed)
Immediate Anesthesia Transfer of Care Note  Patient: Susan Osborn  Procedure(s) Performed: Procedure(s): EXPLORATORY LAPAROTOMY,BILATERAL SALPINGO OOPHORECTOMY, Left ureterolysis,Lysis of adhesions (Left)  Patient Location: PACU  Anesthesia Type:General  Level of Consciousness: awake, alert , oriented and patient cooperative  Airway & Oxygen Therapy: Patient Spontanous Breathing and Patient connected to face mask oxygen  Post-op Assessment: Report given to PACU RN, Post -op Vital signs reviewed and stable and Patient moving all extremities  Post vital signs: Reviewed and stable  Complications: No apparent anesthesia complications

## 2013-01-13 NOTE — Op Note (Signed)
Susan Osborn  female MEDICAL RECORD [id number] DATE OF BIRTH: [birth year] PHYSICIAN: De Blanch, M.D  01/13/2013   OPERATIVE REPORT  PREOPERATIVE DIAGNOSIS: Susan Osborn  female MEDICAL RECORD [id number] DATE OF BIRTH: Nov 08, 1965 PHYSICIAN: De Blanch, M.D  01/13/2013   OPERATIVE REPORT  PREOPERATIVE DIAGNOSIS: Complex left ovarian mass POSTOPERATIVE DIAGNOSIS: Same with severe pelvic adhesions and cyst of right ovary. Retroperitoneal fibrosis  PROCEDURE: Extensive lysis of adhesions (in excess of 45 minutes), bilateral salpingo-oophorectomy, left ureteral lysis.  SURGEON: De Blanch, M.D ASSISTANT: Antionette Char M.D., Telford Nab RN ANESTHESIA: Gen. with oral tracheal tube ESTIMATED BLOOD LOSS: 100 mL  SURGICAL FINDINGS: Upon opening the abdomen the patient had extensive scarring in the subcutaneous tissue fascia and in the midline. The bladder was found to be scarred anteriorly to the abdominal wall. There were extensive small bowel the small bowel and colonic adhesions. Due ovarian masses were both densely adherent to the bowel as well. Due to retroperitoneal fibrosis a left ureteral lysis was necessitated in order to remove the ovarian mass safely. Because of adhesions the upper abdomen was not explored and the appendix was not identified.  PROCEDURE: The patient was brought to the operating room and after satisfactory attainment of general anesthesia was placed in a modified lithotomy position in [place] stirrups. The anterior abdominal wall, perineum and vagina were prepped, Foley catheter was inserted, the patient was draped. A time-out was taken, SCDs were in place, prophylactic antibiotics were administered.  The abdomen was entered through a previous Pfannenstiel incision excising the prior scar. We encountered extensive scarring. As we advanced the rectus fascia off the rectus muscle was recognized the rectus muscle was no  longer attached to the pubis. In the distal lower abdomen it was recognized that the bladder was adherent to the anterior abdominal wall and pulled cephalad. As we entered the peritoneal cavity extensive adhesions were identified. We initially began to lyse adhesions but found are exposure compromised. Therefore, we extended the incision in the midline up to the umbilicus. We then had enough exposure to continue lysing adhesions. Left ovarian mass which is approximately 7 cm in diameter was identified which was densely adherent to the sigmoid colon lateral pelvic sidewall and anterior abdominal wall peritoneum. Using sharp and blunt dissection adhesions were lysed. Radial to enter the left retroperitoneal space identified the vessels and ureter. The ovarian vessels were identified skeletonized clamped cut free tied and suture-ligated with care taken to avoid injury to the ureter. Ureteral lysis was then performed and the ureter was held laterally. Using continued sharp and blunt dissection the ovarian mass was freed from the sigmoid mesentery, sigmoid serosa, and pelvic sidewall. Ultimately the mass was removed and submitted to frozen section. Frozen section returned as a benign ovarian cyst. The left side of the pelvis was evaluated and found to be hemostatic.  Explore the right side of the pelvis we found an ovarian mass. This was densely adherent to the omentum and cecum. It was reexplored this ovarian mass was identified it was ovarian cyst measuring approximately 2 cm in diameter. Ultimately the ovarian vessels were isolated clamped cut and doubly free tied. The remainder of the ovary and the cyst were submitted to pathology. The pelvis was irrigated with copious amounts of warm saline. It was reinspected and found to be hemostatic.    The transverse incision in the fascia was closed first using a running mass closure using #1 PDS. We then closed the midline incision with a separate suture  of #1 PDS.  Subcutaneous tissue was irrigated and hemostasis achieved with cautery.Experil was injected in the subcutaneous layer.  . Skin was closed with skin staples. A dressing was applied.  Patient was awakened from anesthesia and taken to the recovery room in satisfactory condition. Sponge needle and instrument counts correct times two.   De Blanch, M.D  POSTOPERATIVE DIAGNOSIS:  PROCEDURE:   SURGEON: De Blanch, M.D ASSISTANT: Antionette Char M.D., Telford Nab RN ANESTHESIA:  ESTIMATED BLOOD LOSS:  SURGICAL FINDINGS:  PROCEDURE: The patient was brought to the operating room and after satisfactory attainment of general anesthesia was placed in a modified lithotomy position in Prue stirrups. The anterior abdominal wall, perineum and vagina were prepped, Foley catheter was inserted, the patient was draped. A time-out was taken, SCDs were in place, prophylactic antibiotics were administered.  The abdomen was entered through a vertical incision. Peritoneal washings were taken and sent to cytopathology.   Bookwalter retractor was assembled and  the small bowel and sigmoid colon were elevated out of the pelvis thus exposing the uterus. The uterus was grasped with two long Kelly clamps.  The right round ligament was divided the retroperitoneal spaces opened. The iliac vessels and ureter were identified. The ovarian vessels were skeletonized clamped cut free tied and suture ligated. Similar procedures performed left side the pelvis. The bladder flap was advanced to sharp and blunt dissection. Uterine vessels were skeletonized then clamped cut and suture ligated. Vaginal angles were encountered, crossclamped and the vagina transected from its connection to the cervix. The uterus, cervix,tubes and ovaries were handed off the operative field. The vaginal angles were transfixed with 0 Vicryl the central portion of vagina closed with interrupted figure-of-eight sutures of 0 Vicryl. The  pelvis was irrigated and hemostasis was ascertained.  The retractor was taken down   The abdomen and pelvis were irrigated.  The anterior abdominal wall was closed in layers the first being a running mass closure using #1 PDS. Subcutaneous tissue was irrigated and hemostasis achieved with cautery. . Skin was closed with skin staples. A dressing was applied.  Patient was awakened from anesthesia and taken to the recovery room in satisfactory condition. Sponge needle and instrument counts correct times two.   De Blanch, M.D

## 2013-01-14 ENCOUNTER — Encounter (HOSPITAL_COMMUNITY): Payer: Self-pay | Admitting: Gynecology

## 2013-01-14 LAB — CBC
HCT: 26.4 % — ABNORMAL LOW (ref 36.0–46.0)
HCT: 26.3 % — ABNORMAL LOW (ref 36.0–46.0)
Hemoglobin: 8.8 g/dL — ABNORMAL LOW (ref 12.0–15.0)
MCH: 30.7 pg (ref 26.0–34.0)
MCH: 30.6 pg (ref 26.0–34.0)
MCV: 92.6 fL (ref 78.0–100.0)
RBC: 2.87 MIL/uL — ABNORMAL LOW (ref 3.87–5.11)
RBC: 2.84 MIL/uL — ABNORMAL LOW (ref 3.87–5.11)
WBC: 5.9 10*3/uL (ref 4.0–10.5)

## 2013-01-14 LAB — BASIC METABOLIC PANEL
BUN: 8 mg/dL (ref 6–23)
Chloride: 103 mEq/L (ref 96–112)
GFR calc non Af Amer: 86 mL/min — ABNORMAL LOW (ref >90)
Glucose, Bld: 127 mg/dL — ABNORMAL HIGH (ref 70–99)
Potassium: 4.2 mEq/L (ref 3.5–5.1)
Sodium: 134 mEq/L — ABNORMAL LOW (ref 135–145)

## 2013-01-14 NOTE — Progress Notes (Signed)
1 Day Post-Op Procedure(s) (LRB): EXPLORATORY LAPAROTOMY,BILATERAL SALPINGO OOPHORECTOMY, Left ureterolysis,Lysis of adhesions (Left)  Subjective: Patient reports moderate incisional pain with movement.  Adequate pain relief reported with PRN pain medication.  Reporting episode of nausea early this am that has resolved at this time.  Denies passing flatus or having a bowel movement.  Reporting mild dizziness with sitting on the side of the bed for the first time but no other episodes.  Denies chest pain, dyspnea, or emesis.     Objective: Vital signs in last 24 hours: Temp:  [97.4 F (36.3 C)-99 F (37.2 C)] 98.4 F (36.9 C) (06/04 0545) Pulse Rate:  [45-81] 63 (06/04 0545) Resp:  [10-20] 18 (06/04 0545) BP: (100-165)/(49-97) 104/55 mmHg (06/04 0545) SpO2:  [98 %-100 %] 100 % (06/04 0545) Weight:  [137 lb 5.6 oz (62.3 kg)] 137 lb 5.6 oz (62.3 kg) (06/03 1615)    Intake/Output from previous day: 06/03 0701 - 06/04 0700 In: 4112.5 [I.V.:4112.5] Out: 1110 [Urine:1010; Blood:100]  Physical Examination: General: alert, cooperative and no distress Resp: clear to auscultation bilaterally Cardio: regular rate and rhythm, S1, S2 normal, no murmur, click, rub or gallop GI: soft, non-tender; bowel sounds normal; no masses,  no organomegaly and incision: midline and low transverse incision with staples open to air.  Minimal amount of sanguinous drainage from the low transverse incision. Extremities: extremities normal, atraumatic, no cyanosis or edema  Labs: WBC/Hgb/Hct/Plts:  5.9/8.7/26.3/169 (06/04 1040) BUN/Cr/glu/ALT/AST/amyl/lip:  8/0.81/--/--/--/--/-- (06/04 0426)  Assessment: 47 y.o. s/p Procedure(s): EXPLORATORY LAPAROTOMY,BILATERAL SALPINGO OOPHORECTOMY, Left ureterolysis,Lysis of adhesions: stable Pain:  Pain is well-controlled on PRN medications.  Heme:  Hgb 8.8 and Hct 26.4 early this am.  Repeat H&H at 10:40 am today was Hgb 8.7 and Hct 26.3.  100 cc EBL in surgery on  01/13/13.  Continue to monitor.  Patient asymptomatic at this time.    CV:  BP and HR stable post-operatively.  GI:  Tolerating po: Yes     FEN:  Stable post-operatively.  Prophylaxis: intermittent pneumatic compression boots.  Plan: Advance diet as tolerated Repeat AM Labs Hold on lovenox at this time Encourage ambulation, IS use, deep breathing, and coughing Continue post-operative plan of care   LOS: 1 day    Susan Osborn DEAL 01/14/2013, 10:54 AM

## 2013-01-14 NOTE — Care Management Note (Signed)
    Page 1 of 1   01/14/2013     11:17:58 AM   CARE MANAGEMENT NOTE 01/14/2013  Patient:  Susan Osborn, Susan Osborn   Account Number:  [id number]  Date Initiated:  01/14/2013  Documentation initiated by:  Lorenda Ishihara  Subjective/Objective Assessment:   47 yo female admitted s/p open lysis of adhesions, BSO. PTA lived at home with spouse.     Action/Plan:   Home when stable   Anticipated DC Date:  01/17/2013   Anticipated DC Plan:  HOME/SELF CARE      DC Planning Services  CM consult      Choice offered to / List presented to:             Status of service:  Completed, signed off Medicare Important Message given?   (If response is "NO", the following Medicare IM given date fields will be blank) Date Medicare IM given:   Date Additional Medicare IM given:    Discharge Disposition:  HOME/SELF CARE  Per UR Regulation:  Reviewed for med. necessity/level of care/duration of stay  If discussed at Long Length of Stay Meetings, dates discussed:    Comments:

## 2013-01-15 LAB — CBC
HCT: 26.3 % — ABNORMAL LOW (ref 36.0–46.0)
Hemoglobin: 8.4 g/dL — ABNORMAL LOW (ref 12.0–15.0)
MCH: 29.7 pg (ref 26.0–34.0)
MCHC: 31.9 g/dL (ref 30.0–36.0)
RDW: 14 % (ref 11.5–15.5)

## 2013-01-15 NOTE — Progress Notes (Signed)
Patient informed of final pathology results.  No concerns voiced about results.  Reporting dizziness when getting out of bed and ambulating.  Instructed to increase fluid intake.  Staff notified that orthostatic vital signs ordered this am still needed to be performed.

## 2013-01-15 NOTE — Progress Notes (Signed)
2 Days Post-Op Procedure(s) (LRB): EXPLORATORY LAPAROTOMY,BILATERAL SALPINGO OOPHORECTOMY, Left ureterolysis,Lysis of adhesions (Left)  Subjective: Patient reports "feeling better."  Minimal incisional pain with movement.  Adequate pain relief reported with PRN pain medication.  Having "some nausea and some dizziness but not a lot."  Passing flatus.  Tolerating diet.  Denies having a bowel movement.  Denies chest pain, dyspnea, or emesis.  No concerns voiced.     Objective: Vital signs in last 24 hours: Temp:  [98.8 F (37.1 C)-99.9 F (37.7 C)] 98.8 F (37.1 C) (06/05 0718) Pulse Rate:  [61-88] 88 (06/05 0550) Resp:  [18] 18 (06/05 0550) BP: (102-122)/(46-69) 122/69 mmHg (06/05 0550) SpO2:  [95 %-100 %] 97 % (06/05 0550) Last BM Date: 01/13/13  Intake/Output from previous day: 06/04 0701 - 06/05 0700 In: 480 [P.O.:480] Out: 1350 [Urine:1350]  Physical Examination: General: alert, cooperative and no distress Resp: clear to auscultation bilaterally Cardio: regular rate and rhythm, S1, S2 normal, no murmur, click, rub or gallop GI: soft, non-tender; bowel sounds normal; no masses,  no organomegaly and incision: midline and low transverse incision with staples open to air.  Minimal amount of sanguinous drainage from the midline incision. Extremities: extremities normal, atraumatic, no cyanosis or edema  Labs: WBC/Hgb/Hct/Plts:  5.3/8.4/26.3/155 (06/05 1610)    Assessment: 47 y.o. s/p Procedure(s): EXPLORATORY LAPAROTOMY,BILATERAL SALPINGO OOPHORECTOMY, Left ureterolysis,Lysis of adhesions: stable Pain:  Pain is well-controlled on PRN medications.  Heme:  Hgb 8.4 and Hct 26.3 this am: stable.  100 cc EBL in surgery on 01/13/13.      CV:  BP and HR stable post-operatively.  GI:  Tolerating po: Yes.  Passing flatus.     FEN:  Stable post-operatively.  Prophylaxis: intermittent pneumatic compression boots.  Plan: Orthostatic vitals this am to evaluate dizziness with  movement Encourage ambulation, IS use, deep breathing, and coughing Continue post-operative plan of care   LOS: 2 days    CROSS, MELISSA DEAL 01/15/2013, 9:03 AM

## 2013-01-16 NOTE — Progress Notes (Signed)
3 Days Post-Op Procedure(s) (LRB): EXPLORATORY LAPAROTOMY,BILATERAL SALPINGO OOPHORECTOMY, Left ureterolysis,Lysis of adhesions (Left)  Subjective: Patient reports improvement in dizziness compared with yesterday.  Minimal incisional pain with movement.  Adequate pain relief reported with PRN pain medication.  Passing flatus and having bowel movements.  Tolerating diet.  Denies chest pain, dyspnea, nausea, or emesis.  No concerns voiced.     Objective: Vital signs in last 24 hours: Temp:  [98.3 F (36.8 C)-99.3 F (37.4 C)] 99.3 F (37.4 C) (06/06 1416) Pulse Rate:  [66-83] 66 (06/06 1416) Resp:  [18] 18 (06/06 1416) BP: (136-164)/(7-94) 136/74 mmHg (06/06 1416) SpO2:  [99 %-100 %] 100 % (06/06 1416) Last BM Date: 01/15/13  Intake/Output from previous day: 06/05 0701 - 06/06 0700 In: 240 [P.O.:240] Out: 1250 [Urine:1250]  Physical Examination: General: alert, cooperative and no distress Resp: clear to auscultation bilaterally Cardio: regular rate and rhythm, S1, S2 normal, no murmur, click, rub or gallop GI: soft, non-tender; bowel sounds normal; no masses,  no organomegaly and incision: midline and low transverse incision with staples open to air.  Minimal amount of sanguinous drainage from the midline incision. Extremities: extremities normal, atraumatic, no cyanosis or edema  Assessment: 47 y.o. s/p Procedure(s): EXPLORATORY LAPAROTOMY,BILATERAL SALPINGO OOPHORECTOMY, Left ureterolysis,Lysis of adhesions: stable Pain:  Pain is well-controlled on PRN medications.  Heme:  Hgb and Hct stable.  100 cc EBL in surgery on 01/13/13.      CV:  BP and HR stable post-operatively.  GI:  Tolerating po: Yes.  Passing flatus and having bowel movements.     FEN:  Stable post-operatively.  Prophylaxis: intermittent pneumatic compression boots.  Plan: Plan for discharge in the am Encourage ambulation, IS use, deep breathing, and coughing Continue post-operative plan of care   LOS:  3 days    Susan Osborn DEAL 01/16/2013, 2:50 PM

## 2013-01-17 DIAGNOSIS — D62 Acute posthemorrhagic anemia: Secondary | ICD-10-CM | POA: Diagnosis not present

## 2013-01-17 DIAGNOSIS — N801 Endometriosis of ovary: Secondary | ICD-10-CM | POA: Diagnosis present

## 2013-01-17 MED ORDER — ESTRADIOL 0.1 MG/24HR TD PTWK: 1.0000 | MEDICATED_PATCH | TRANSDERMAL | Status: DC

## 2013-01-17 MED ORDER — ESTRADIOL 0.1 MG/24HR TD PTWK
0.1000 mg | MEDICATED_PATCH | TRANSDERMAL | Status: DC
  Administered 2013-01-17: 0.1 mg via TRANSDERMAL
  Filled 2013-01-17 (×2): qty 1

## 2013-01-17 MED ORDER — OXYCODONE-ACETAMINOPHEN 5-325 MG PO TABS: 1.0000 | ORAL_TABLET | ORAL | Status: DC | PRN

## 2013-01-17 MED ORDER — FERROUS SULFATE 325 (65 FE) MG PO TABS: 325.0000 mg | ORAL_TABLET | Freq: Two times a day (BID) | ORAL | Status: DC

## 2013-01-17 NOTE — Discharge Summary (Signed)
Physician Discharge Summary  Patient ID: Susan Osborn MRN: [id number] DOB/AGE: 45-Aug-1967 45 y.o.  Admit date: 01/13/2013 Discharge date: 01/17/2013  Admission Diagnoses: Active Problems:   Ovarian cyst   Postoperative anemia due to acute blood loss  Discharge Diagnoses:  Active Problems:   Ovarian cyst   Postoperative anemia due to acute blood loss   Endometrioma of ovary   Discharged Condition: stable  Hospital Course: On 01/13/2013, the patient underwent the following: Procedure(s): EXPLORATORY LAPAROTOMY,BILATERAL SALPINGO OOPHORECTOMY, Left ureterolysis,Lysis of adhesions.   The postoperative course was anemia secondary to intra/perioperative blood loss.  She remained hemodynamically stable/asymptomatic.  She was discharged to home on postoperative day 4 tolerating a regular diet.  Consults: None  Significant Diagnostic Studies: none  Treatments: surgery: see above  Discharge Exam: Blood pressure 124/62, pulse 78, temperature 98.4 F (36.9 C), temperature source Oral, resp. rate 18, height 5\' 2"  (1.575 m), weight 137 lb 5.6 oz (62.3 kg), SpO2 100.00%. General appearance: alert and I: C/D/I GI: soft, non-tender; bowel sounds normal; no masses,  no organomegaly Extremities: extremities normal, atraumatic, no cyanosis or edema Incision/Wound:  Disposition: 01-Home or Self Care      Discharge Orders   Future Orders Complete By Expires     Call MD for:  extreme fatigue  As directed     Call MD for:  persistant dizziness or light-headedness  As directed     Call MD for:  persistant nausea and vomiting  As directed     Call MD for:  redness, tenderness, or signs of infection (pain, swelling, redness, odor or green/yellow discharge around incision site)  As directed     Call MD for:  severe uncontrolled pain  As directed     Call MD for:  temperature >100.4  As directed     Diet - low sodium heart healthy  As directed     Discharge wound care:  As directed     Comments:      Keep clean and dry    Driving Restrictions  As directed     Comments:      No driving for 1- 2 weeks    Increase activity slowly  As directed     Lifting restrictions  As directed     Comments:      No lifting > 5 lbs for 6 weeks    May shower / Bathe  As directed     Comments:      No tub baths for 6 weeks    May walk up steps  As directed     Sexual Activity Restrictions  As directed     Comments:      No intercourse for 6  weeks        Medication List    STOP taking these medications       promethazine 25 MG tablet  Commonly known as:  PHENERGAN      TAKE these medications       estradiol 0.1 mg/24hr  Commonly known as:  CLIMARA - Dosed in mg/24 hr  Place 1 patch (0.1 mg total) onto the skin once a week.     ferrous sulfate 325 (65 FE) MG tablet  Take 1 tablet (325 mg total) by mouth 2 (two) times daily before a meal.     multivitamin with minerals Tabs  Take 1 tablet by mouth daily.     oxyCODONE-acetaminophen 5-325 MG per tablet  Commonly known as:  PERCOCET/ROXICET  Take 1-2  tablets by mouth every 4 (four) hours as needed.       Follow-up Information   Please follow up. (call [phone])       Signed: Antionette Char A 01/17/2013, 1:09 PM

## 2013-01-17 NOTE — Progress Notes (Signed)
Assessment unchanged. Pt verbalized understanding of dc instructions through teach back. Script x 1 given as provided by MD. Introduced to My Chart. Discharged via wc to front entrance to meet awaiting vehicle to carry home. Accompanied by NT and daughter.

## 2013-01-19 ENCOUNTER — Telehealth: Payer: Self-pay | Admitting: Gynecologic Oncology

## 2013-01-19 NOTE — Telephone Encounter (Signed)
Post op telephone call to check patient status.  Patient describes expected post operative status.  Adequate PO intake reported.  Bowels and bladder functioning without difficulty.  Pain minimal.  Reportable signs and symptoms reviewed.  She reports taking three percocet at a time.  Informed of the risks of excessive tylenol use and/or possible oversedation.  Recommended a maximum of two percocet tablets at a time for pain relief.  Verbalizing understanding.  She is to come to the office tomorrow around 10 am for staple removal.  Follow up appt given.

## 2013-01-20 ENCOUNTER — Telehealth: Payer: Self-pay | Admitting: Gynecologic Oncology

## 2013-01-20 ENCOUNTER — Encounter: Payer: Self-pay | Admitting: Gynecologic Oncology

## 2013-01-20 ENCOUNTER — Other Ambulatory Visit: Payer: PRIVATE HEALTH INSURANCE | Admitting: Lab

## 2013-01-20 ENCOUNTER — Ambulatory Visit: Payer: PRIVATE HEALTH INSURANCE | Attending: Gynecology | Admitting: Gynecologic Oncology

## 2013-01-20 VITALS — BP 100/68 | HR 68 | Temp 98.4°F | Resp 18 | Ht 62.0 in | Wt 129.1 lb

## 2013-01-20 DIAGNOSIS — D62 Acute posthemorrhagic anemia: Secondary | ICD-10-CM

## 2013-01-20 DIAGNOSIS — R42 Dizziness and giddiness: Secondary | ICD-10-CM

## 2013-01-20 LAB — CBC WITH DIFFERENTIAL/PLATELET
Basophils Absolute: 0 10*3/uL (ref 0.0–0.1)
Eosinophils Absolute: 0.2 10*3/uL (ref 0.0–0.5)
HGB: 10 g/dL — ABNORMAL LOW (ref 11.6–15.9)
MONO#: 0.4 10*3/uL (ref 0.1–0.9)
NEUT#: 4.3 10*3/uL (ref 1.5–6.5)
RDW: 15.3 % — ABNORMAL HIGH (ref 11.2–14.5)
WBC: 6.3 10*3/uL (ref 3.9–10.3)
lymph#: 1.4 10*3/uL (ref 0.9–3.3)

## 2013-01-20 LAB — BASIC METABOLIC PANEL (CC13)
BUN: 12 mg/dL (ref 7.0–26.0)
Chloride: 102 mEq/L (ref 98–107)
Glucose: 135 mg/dl — ABNORMAL HIGH (ref 70–99)
Potassium: 4.1 mEq/L (ref 3.5–5.1)

## 2013-01-20 NOTE — Telephone Encounter (Signed)
Attempted to call patient to discuss lab results and to move follow up appt to June 18.  Will retry at a later time.

## 2013-01-20 NOTE — Patient Instructions (Signed)
Shower daily and keep incision clean and dry.  Steri strips will stay in place for around one week and after one week you can remove them if they are still present.  Use a stool softener twice daily and/or miralax until + BM.  Plan to follow up on Thursday for incision check.  Reportable signs and symptoms reviewed.  You can use ibuprofen 600-800mg  two to three times daily as needed for pain also with food.  Please call for any concerns.  We will contact you with the results of your lab work.

## 2013-01-20 NOTE — Progress Notes (Signed)
Follow Up Note: Gyn-Onc  Susan Osborn 47 y.o. female  CC:  Chief Complaint  Patient presents with  . Routine Post Op    Follow up    HPI:  Susan Osborn is a 47 year old female referred by Dr.[name] for a pelvic mass. The patient presented to the emergency room on 11/16/2012 with pain. A CT scan was obtained showing a 7.2 x 10.5 x 9.3 cm large complex left ovarian mass. No evidence of adenopathy or ascites or peritoneal thickening.  On 01/13/13, she underwent extensive lysis of adhesions (in excess of 45 minutes), bilateral salpingo-oophorectomy, left ureteral lysis by Dr. Stanford Breed.  Final pathology revealed 1. Ovary and fallopian tube, left - BENIGN ENDOMETRIOTIC CYST / ENDOMETRIOSIS, NO ATYPIA OR MALIGNANCY. - BENIGN FALLOPIAN TUBAL TISSUE. - NO ATYPIA OR MALIGNANCY.  2. Ovary and fallopian tube, right - BENIGN OVARIAN TISSUE WITH CORPUS LUTEAL CYST. - BENIGN FALLOPIAN TUBAL TISSUE WITH ENDOMETRIOSIS. NO ATYPIA OR MALIGNANCY.  Her postoperative course included anemia post-operatively secondary to intra/perioperative blood loss. She remained hemodynamically stable/asymptomatic.   Interval History:  She presents today for post-operative evaluation and stable removal.  She states "I think I am doing pretty good."  She describes expected post operative status with adequate PO intake reported.  She is passing flatus with her last bowel movement two days ago.  She denies nausea or emesis and reports adequate pain relief with use of two percocet tablets every four to six hours.  She denies dysuria, hematuria, frequency, or urgency but reports lower abdominal pressure when bladder is empty after voiding.  She reports falling yesterday after getting out of bed quickly and becoming lightheaded.  She states that she did not injure herself and her family was able to assist her to a standing position.  She denies  hitting her head.  She reports taking her iron tablets twice daily as prescribed.  She reports intermittent low grade temperatures at home of 99.0 and 99.4 with no chills or signs of infection to her knowledge.  She denies cough, urinary symptoms, or drainage from her abdominal incisions.  She states that she has been lightly washing the incisions daily.  No other concerns voiced.      Review of Systems  Constitutional: Feels well.  Cardiovascular: No chest pain, shortness of breath, or edema.  Pulmonary: No cough or wheeze.  Gastrointestinal: No nausea, vomiting, or diarrhea. No bright red blood per rectum or change in bowel movement.  Genitourinary: No frequency, urgency, or dysuria. No vaginal bleeding or discharge.  Musculoskeletal: No myalgia or joint pain. Neurologic: No weakness, numbness, or change in gait.  Psychology: No depression, anxiety, or insomnia.   Current Meds:  Outpatient Encounter Prescriptions as of 01/20/2013  Medication Sig Dispense Refill  . estradiol (CLIMARA - DOSED IN MG/24 HR) 0.1 mg/24hr Place 1 patch (0.1 mg total) onto the skin once a week.  4 patch  12  . ferrous sulfate 325 (65 FE) MG tablet Take 1 tablet (325 mg total) by mouth 2 (two) times daily before a meal.  60 tablet  11  . Multiple Vitamin (MULTIVITAMIN WITH MINERALS) TABS Take 1 tablet by mouth daily.      Marland Kitchen oxyCODONE-acetaminophen (PERCOCET/ROXICET) 5-325 MG per tablet Take 1-2 tablets by mouth every 4 (four) hours as needed.  30 tablet  0   No facility-administered encounter medications on file as of 01/20/2013.    Allergy: No Known Allergies  Social Hx:   History   Social History  .  Marital Status: Legally Separated    Spouse Name: N/A    Number of Children: N/A  . Years of Education: N/A   Occupational History  . Not on file.   Social History Main Topics  . Smoking status: Former Smoker    Quit date: 12/12/2010  . Smokeless tobacco: Never Used  . Alcohol Use: Yes     Comment:  occas  . Drug Use: No  . Sexually Active: No   Other Topics Concern  . Not on file   Social History Narrative  . No narrative on file    Past Surgical Hx:  Past Surgical History  Procedure Laterality Date  . Cesarean section      x3  . Abdominal hysterectomy  1998    partial  . Laparotomy Left 01/13/2013    Procedure: EXPLORATORY LAPAROTOMY,BILATERAL SALPINGO OOPHORECTOMY, Left ureterolysis,Lysis of adhesions;  Surgeon: Jeannette Corpus, MD;  Location: [facility];  Service: Gynecology;  Laterality: Left;    Past Medical Hx:  Past Medical History  Diagnosis Date  . Anemia   . Pelvic mass in female   . Headache(784.0)     hx of related to anemia     Family Hx:  Family History  Problem Relation Age of Onset  . Breast cancer Mother   . Stomach cancer Mother   . Emphysema Father   . Kidney failure Brother   . Pancreatic cancer Maternal Aunt   . Stomach cancer Paternal Grandfather     Vitals:  Blood pressure 100/68, pulse 68, temperature 98.4 F (36.9 C), resp. rate 18, height 5\' 2"  (1.575 m), weight 129 lb 1.6 oz (58.559 kg).  Physical Exam:  General: Well developed, well nourished female in no acute distress. Alert and oriented x 3.  Appearing lethargic.  To the office in her night gown and robe.  Limited mobility due to incisional pain with movement.  Cardiovascular: Regular rate and rhythm. S1 and S2 normal.  Lungs: Clear to auscultation bilaterally. No wheezes/crackles/rhonchi noted.  Skin: No rashes or lesions present. Back: No CVA tenderness.  Abdomen: Abdomen soft and non-obese. Active bowel sounds in all quadrants. Mildly tender with light palpation.  28 total staples removed from the midline and low transverse abdominal incisions.  Minimal amount of crusted drainage noted around the staples.  No active drainage noted.  No erythema or increased warmth around the incisions.  Both incisions were cleansed with normal saline.  1/2 inch steri strips were applied.     Extremities: No bilateral cyanosis, edema, or clubbing.   Assessment/Plan:  47 year old female status post extensive lysis of adhesions (in excess of 45 minutes), bilateral salpingo-oophorectomy, left ureteral lysis by Dr. Stanford Breed on 01/13/13.  Final pathology revealed 1. Ovary and fallopian tube, left - BENIGN ENDOMETRIOTIC CYST / ENDOMETRIOSIS, NO ATYPIA OR MALIGNANCY. - BENIGN FALLOPIAN TUBAL TISSUE. - NO ATYPIA OR MALIGNANCY.  2. Ovary and fallopian tube, right - BENIGN OVARIAN TISSUE WITH CORPUS LUTEAL CYST. - BENIGN FALLOPIAN TUBAL TISSUE WITH ENDOMETRIOSIS. NO ATYPIA OR MALIGNANCY.  28 staples were removed without difficulty.  We will check a CBC today due to low grade temperatures, lethargic state, and to assess hemoglobin and hematocrit since discharge.  We will check a Bmet today to assess for electrolyte imbalances as a potential cause for her continued lightheadedness.  She is advised on fall precautions and moving slowly due to significant change from pre-op H&H levels.  She is advised on incisional care along with reportable signs and symptoms.  She is advised to shower daily, cleanse the incision with mild soap and water, and to keep the incisions clean and dry.  She is advised to begin taking Colace twice daily and/or Miralax to prevent constipation.  She is advised to begin taking Ibuprofen 600-800 mg two to three times daily as needed for mild pain.  Reportable signs and symptoms reviewed and she is advised to call for any questions or concerns.  Follow up appointment was made for this Thursday if needed based on lab results and for an incision check.         Jendaya Gossett DEAL, NP 01/20/2013, 1:12 PM

## 2013-01-20 NOTE — Telephone Encounter (Signed)
Attempted to call pt with lab results and to reschedule her appt.  Will retry at a later time.

## 2013-01-22 ENCOUNTER — Telehealth: Payer: Self-pay | Admitting: Gynecologic Oncology

## 2013-01-22 ENCOUNTER — Ambulatory Visit: Payer: PRIVATE HEALTH INSURANCE | Admitting: Gynecologic Oncology

## 2013-01-22 NOTE — Telephone Encounter (Signed)
Spoke with the patient about current status.  Stating that she is "doing pretty good."  Reporting minimal amount of serosanguinous drainage from incision.  No fevers or chills.  Minimal pain.  Instructed to call for any concerns.  Informed of lab results.  Instructed that her appt for June 12 would be moved out till June 18 since she reports doing well.

## 2013-01-26 ENCOUNTER — Telehealth: Payer: Self-pay | Admitting: Gynecologic Oncology

## 2013-01-26 NOTE — Telephone Encounter (Signed)
Called to check on patient's post-operative status.  Reporting minimal amount of serous drainage from her incision.  Denies fever, chills, abdominal pain, nausea, or emesis.  To follow up on Wed., June 18 for an incision check.  No concerns voiced.  Instructed to call for any needs.

## 2013-01-28 ENCOUNTER — Ambulatory Visit: Payer: PRIVATE HEALTH INSURANCE | Attending: Gynecology | Admitting: Gynecologic Oncology

## 2013-01-28 ENCOUNTER — Encounter: Payer: Self-pay | Admitting: Gynecologic Oncology

## 2013-01-28 VITALS — BP 106/68 | HR 64 | Temp 98.5°F | Resp 16 | Ht 62.0 in | Wt 123.4 lb

## 2013-01-28 DIAGNOSIS — R1900 Intra-abdominal and pelvic swelling, mass and lump, unspecified site: Secondary | ICD-10-CM

## 2013-01-29 NOTE — Patient Instructions (Signed)
Keep your incision clean and dry.  Please call for fever, drainage, redness, increased warmth, opening of the incision, or bleeding from the incisions.  Follow up appointment arranged for 03/06/13.  Please call for any questions or concerns.

## 2013-01-29 NOTE — Progress Notes (Signed)
Follow Up Note: Gyn-Onc  Susan Osborn 47 y.o. female  CC:  Chief Complaint  Patient presents with  . Post-op Follow up    Follow up    HPI:  Susan Osborn is a 47 year old female referred by Dr.[name] for a pelvic mass. The patient presented to the emergency room on 11/16/2012 with pain. A CT scan was obtained showing a 7.2 x 10.5 x 9.3 cm large complex left ovarian mass. No evidence of adenopathy or ascites or peritoneal thickening.  On 01/13/13, she underwent extensive lysis of adhesions (in excess of 45 minutes), bilateral salpingo-oophorectomy, left ureteral lysis by Dr. Stanford Breed.  Final pathology revealed 1. Ovary and fallopian tube, left - BENIGN ENDOMETRIOTIC CYST / ENDOMETRIOSIS, NO ATYPIA OR MALIGNANCY. - BENIGN FALLOPIAN TUBAL TISSUE. - NO ATYPIA OR MALIGNANCY.  2. Ovary and fallopian tube, right - BENIGN OVARIAN TISSUE WITH CORPUS LUTEAL CYST. - BENIGN FALLOPIAN TUBAL TISSUE WITH ENDOMETRIOSIS. NO ATYPIA OR MALIGNANCY.  Her postoperative course included anemia post-operatively secondary to intra/perioperative blood loss. She remained hemodynamically stable/asymptomatic.   Interval History:  She presents today for post-operative evaluation and incision check.  She states "I am doing better."  She reports moderate amount of dark red to brownish blood draining from her incision two days ago.  She reports watching television and feeling a wet sensation on her abdomen when she noticed the bleeding.  Minimal amount of drainage reported this am from the right aspect of the lower transverse incision.  She describes expected post operative status with adequate PO intake reported.  She is passing flatus with her last bowel movement this am.  She denies nausea or emesis and reports adequate pain relief with use of two percocet tablets every four to six hours as needed.  She denies dysuria, hematuria,  frequency, or urgency.  Reports improvement in lower abdominal pressure when bladder is empty after voiding.  She reports taking her iron tablets twice daily as prescribed.  No chills, fever, or signs of infection reported with temperatures around 98.9 and 99.0 at home.  She denies cough, urinary symptoms, or drainage from her abdominal incisions.  She states that she has been lightly washing the incisions daily.  No other concerns voiced.      Review of Systems  Constitutional: Feels well. No fever or chills. Cardiovascular: No chest pain, shortness of breath, or edema.  Pulmonary: No cough or wheeze.  Gastrointestinal: No nausea, vomiting, or diarrhea. No bright red blood per rectum or change in bowel movement.  Genitourinary: No frequency, urgency, or dysuria. No vaginal bleeding or discharge.  Musculoskeletal: No myalgia or joint pain. Neurologic: No weakness, numbness, or change in gait.  Psychology: No depression, anxiety, or insomnia.   Current Meds:  Outpatient Encounter Prescriptions as of 01/28/2013  Medication Sig Dispense Refill  . estradiol (CLIMARA - DOSED IN MG/24 HR) 0.1 mg/24hr Place 1 patch (0.1 mg total) onto the skin once a week.  4 patch  12  . ferrous sulfate 325 (65 FE) MG tablet Take 1 tablet (325 mg total) by mouth 2 (two) times daily before a meal.  60 tablet  11  . Multiple Vitamin (MULTIVITAMIN WITH MINERALS) TABS Take 1 tablet by mouth daily.      Marland Kitchen oxyCODONE-acetaminophen (PERCOCET/ROXICET) 5-325 MG per tablet Take 1-2 tablets by mouth every 4 (four) hours as needed.  30 tablet  0   No facility-administered encounter medications on file as of 01/28/2013.    Allergy: No Known Allergies  Social Hx:  History   Social History  . Marital Status: Legally Separated    Spouse Name: N/A    Number of Children: N/A  . Years of Education: N/A   Occupational History  . Not on file.   Social History Main Topics  . Smoking status: Former Smoker    Quit date:  12/12/2010  . Smokeless tobacco: Never Used  . Alcohol Use: Yes     Comment: occas  . Drug Use: No  . Sexually Active: No   Other Topics Concern  . Not on file   Social History Narrative  . No narrative on file    Past Surgical Hx:  Past Surgical History  Procedure Laterality Date  . Cesarean section      x3  . Abdominal hysterectomy  1998    partial  . Laparotomy Left 01/13/2013    Procedure: EXPLORATORY LAPAROTOMY,BILATERAL SALPINGO OOPHORECTOMY, Left ureterolysis,Lysis of adhesions;  Surgeon: Jeannette Corpus, MD;  Location: [facility];  Service: Gynecology;  Laterality: Left;    Past Medical Hx:  Past Medical History  Diagnosis Date  . Anemia   . Pelvic mass in female   . Headache(784.0)     hx of related to anemia     Family Hx:  Family History  Problem Relation Age of Onset  . Breast cancer Mother   . Stomach cancer Mother   . Emphysema Father   . Kidney failure Brother   . Pancreatic cancer Maternal Aunt   . Stomach cancer Paternal Grandfather     Vitals:  Blood pressure 106/68, pulse 64, temperature 98.5 F (36.9 C), temperature source Oral, resp. rate 16, height 5\' 2"  (1.575 m), weight 123 lb 6.4 oz (55.974 kg).  Physical Exam:  General: Well developed, well nourished female in no acute distress. Alert and oriented x 3.   Cardiovascular: Regular rate and rhythm. S1 and S2 normal.  Lungs: Clear to auscultation bilaterally. No wheezes/crackles/rhonchi noted.  Skin: No rashes or lesions present. Back: No CVA tenderness.  Abdomen: Abdomen soft and non-obese. Active bowel sounds in all quadrants. Mildly tender with light palpation.  No active drainage noted.  No erythema or increased warmth around the incisions.  Incisions intact with no blood expressed with light palpation.  Extremities: No bilateral cyanosis, edema, or clubbing.   Assessment/Plan:  47 year old female status post extensive lysis of adhesions (in excess of 45 minutes), bilateral  salpingo-oophorectomy, left ureteral lysis by Dr. Stanford Breed on 01/13/13.  Final pathology revealed 1. Ovary and fallopian tube, left - BENIGN ENDOMETRIOTIC CYST / ENDOMETRIOSIS, NO ATYPIA OR MALIGNANCY. - BENIGN FALLOPIAN TUBAL TISSUE. - NO ATYPIA OR MALIGNANCY.  2. Ovary and fallopian tube, right - BENIGN OVARIAN TISSUE WITH CORPUS LUTEAL CYST. - BENIGN FALLOPIAN TUBAL TISSUE WITH ENDOMETRIOSIS. NO ATYPIA OR MALIGNANCY.  She is advised on incisional care along with reportable signs and symptoms.  She is advised to shower daily, cleanse the incision with mild soap and water, and to keep the incisions clean and dry.  She is advised to begin taking Ibuprofen 600-800 mg two to three times daily as needed for mild pain.  Reportable signs and symptoms reviewed and she is advised to call for any questions or concerns.  Follow up appointment arranged for 02/04/13.    The patient was reviewed with Dr. Stanford Breed.  Amarrion Pastorino DEAL, NP 01/29/2013, 9:39 AM

## 2013-02-03 ENCOUNTER — Telehealth: Payer: Self-pay | Admitting: Gynecologic Oncology

## 2013-02-03 NOTE — Telephone Encounter (Signed)
Patient called requesting refill on pain medication.  Reporting minimal amount of dark red/brownish drainage from the right aspect of the low transverse incision.  Pain level of 5 to 6 with moderate soreness reported with movement.  Instructed that Vicodin 5/325 one to two tablets every six hours as needed for moderate to severe pain, #30, 0 refills called in to [facility] on [address].  Reporting minimal relief with use of Tylenol and ibuprofen use.  Reportable signs and symptoms reviewed.

## 2013-02-26 ENCOUNTER — Other Ambulatory Visit: Payer: Self-pay | Admitting: Gynecologic Oncology

## 2013-03-06 ENCOUNTER — Ambulatory Visit: Payer: PRIVATE HEALTH INSURANCE | Attending: Gynecology | Admitting: Gynecology

## 2013-03-06 ENCOUNTER — Encounter: Payer: Self-pay | Admitting: Gynecology

## 2013-03-06 VITALS — BP 128/60 | HR 86 | Temp 98.3°F | Resp 16 | Ht 62.0 in | Wt 124.3 lb

## 2013-03-06 DIAGNOSIS — Z90710 Acquired absence of both cervix and uterus: Secondary | ICD-10-CM | POA: Insufficient documentation

## 2013-03-06 DIAGNOSIS — Z79899 Other long term (current) drug therapy: Secondary | ICD-10-CM | POA: Insufficient documentation

## 2013-03-06 DIAGNOSIS — Z09 Encounter for follow-up examination after completed treatment for conditions other than malignant neoplasm: Secondary | ICD-10-CM | POA: Insufficient documentation

## 2013-03-06 DIAGNOSIS — Z803 Family history of malignant neoplasm of breast: Secondary | ICD-10-CM | POA: Insufficient documentation

## 2013-03-06 DIAGNOSIS — N83209 Unspecified ovarian cyst, unspecified side: Secondary | ICD-10-CM

## 2013-03-06 NOTE — Patient Instructions (Signed)
You may return to full levels of activity. Please contact Dr. Jean Rosenthal Moore's office to schedule annual gynecologic exam.

## 2013-03-06 NOTE — Progress Notes (Signed)
Consult Note: Gyn-Onc   Susan Osborn 47 y.o. female  Chief Complaint  Patient presents with  . Pelvic mass    Follow up    Assessment : Endometrioma status post bilateral salpingo-oophorectomy and extensive lysis of adhesions. The patient's had good postoperative recovery to return to full levels of activity. Establish care with Dr. Tamela Oddi for annual visits..   Interval history the patient underwent exploratory laparotomy for a complex pelvic mass on 01/13/2013. She was found to have extensive adhesions from prior pelvic surgery requiring conversion of a Pfannenstiel incision to a midline incision. Ultimately we were able to accomplish bilateral salpingo-oophorectomy. 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 Postoperative course was relatively uneventful except for some bleeding from the right lateral aspect of her Pfannenstiel incision. All of this is resolved. Patient reports she has normal GI and GU function.     HPI: 47 year old African American female seen in consultation of Dr.[name] regarding management of a newly diagnosed pelvic mass. The patient presented to the emergency room on 11/16/2012 with pain. A CT scan was obtained showing a 7.2 x 10.5 x 9.3 cm large complex left ovarian mass. No evidence of adenopathy or ascites or peritoneal thickening.  Patient continued to have pain but is extreme. She denies any other GI or GU symptoms.  Obstetrical history: Gravida 3. All deliveries were by C-section.  Patient's had no other gynecologic history. She denies any abnormal Pap smears.  She did have a hysterectomy in 1999 for abnormal uterine bleeding.  She underwent exploratory laparotomy and bilateral salpingo-oophorectomy with extensive lysis of adhesions on 01/13/2013.  Review of Systems:10 point review of systems is negative except as noted in interval history.   Vitals: Blood pressure 128/60, pulse 86, temperature 98.3 F  (36.8 C), temperature source Oral, resp. rate 16, height 5\' 2"  (1.575 m), weight 124 lb 4.8 oz (56.382 kg).  Physical Exam: General : The patient is a healthy woman in no acute distress.  HEENT: normocephalic, extraoccular movements normal; neck is supple without thyromegally  Lynphnodes: Supraclavicular and inguinal nodes not enlarged  Abdomen: Soft, non-tender, no ascites, no organomegally, no masses, no hernias, well-healed Pfannenstiel incision there is some retraction at the right lateral aspect of the fascial incision. Midline incision is healed well. Pelvic:  EGBUS: Normal female  Vagina: Normal, no lesions  Urethra and Bladder: Normal, non-tender  Cervix: Surgically absent  Uterus: Surgically absent  Bi-manual examination: Non-tender; there is fullness in the left adnexa. This is nontender. Rectal: normal sphincter tone, no masses, no blood  Lower extremities: No edema or varicosities. Normal range of motion      No Known Allergies  Past Medical History  Diagnosis Date  . Anemia   . Pelvic mass in female   . Headache(784.0)     hx of related to anemia     Past Surgical History  Procedure Laterality Date  . Cesarean section      x3  . Abdominal hysterectomy  1998    partial  . Laparotomy Left 01/13/2013    Procedure: EXPLORATORY LAPAROTOMY,BILATERAL SALPINGO OOPHORECTOMY, Left ureterolysis,Lysis of adhesions;  Surgeon: Jeannette Corpus, MD;  Location: [facility];  Service: Gynecology;  Laterality: Left;    Current Outpatient Prescriptions  Medication Sig Dispense Refill  . estradiol (CLIMARA - DOSED IN MG/24 HR) 0.1 mg/24hr Place 1 patch (0.1 mg total) onto the skin once a week.  4 patch  12  . ferrous sulfate 325 (65 FE) MG  tablet Take 1 tablet (325 mg total) by mouth 2 (two) times daily before a meal.  60 tablet  11  . Multiple Vitamin (MULTIVITAMIN WITH MINERALS) TABS Take 1 tablet by mouth daily.      Marland Kitchen oxyCODONE-acetaminophen (PERCOCET/ROXICET) 5-325 MG per  tablet Take 1-2 tablets by mouth every 4 (four) hours as needed.  30 tablet  0   No current facility-administered medications for this visit.    History   Social History  . Marital Status: Legally Separated    Spouse Name: N/A    Number of Children: N/A  . Years of Education: N/A   Occupational History  . Not on file.   Social History Main Topics  . Smoking status: Former Smoker    Quit date: 12/12/2010  . Smokeless tobacco: Never Used  . Alcohol Use: Yes     Comment: occas  . Drug Use: No  . Sexually Active: No   Other Topics Concern  . Not on file   Social History Narrative  . No narrative on file    Family History  Problem Relation Age of Onset  . Breast cancer Mother   . Stomach cancer Mother   . Emphysema Father   . Kidney failure Brother   . Pancreatic cancer Maternal Aunt   . Stomach cancer Paternal Grandfather       Jeannette Corpus, MD 03/06/2013, 2:36 PM

## 2013-07-15 ENCOUNTER — Ambulatory Visit: Payer: PRIVATE HEALTH INSURANCE | Admitting: Obstetrics & Gynecology

## 2013-07-15 ENCOUNTER — Encounter: Payer: PRIVATE HEALTH INSURANCE | Admitting: Obstetrics & Gynecology

## 2013-08-24 ENCOUNTER — Encounter: Payer: Self-pay | Admitting: Obstetrics & Gynecology

## 2013-08-24 ENCOUNTER — Ambulatory Visit (INDEPENDENT_AMBULATORY_CARE_PROVIDER_SITE_OTHER): Payer: PRIVATE HEALTH INSURANCE | Admitting: Obstetrics & Gynecology

## 2013-08-24 VITALS — BP 154/84 | HR 63 | Temp 98.5°F | Ht 62.0 in | Wt 134.0 lb

## 2013-08-24 DIAGNOSIS — N390 Urinary tract infection, site not specified: Secondary | ICD-10-CM | POA: Insufficient documentation

## 2013-08-24 DIAGNOSIS — N3943 Post-void dribbling: Secondary | ICD-10-CM

## 2013-08-24 DIAGNOSIS — IMO0002 Reserved for concepts with insufficient information to code with codable children: Secondary | ICD-10-CM | POA: Insufficient documentation

## 2013-08-24 LAB — POCT URINALYSIS DIPSTICK
Bilirubin, UA: NEGATIVE
Ketones, UA: NEGATIVE
SPEC GRAV UA: 1.015
UROBILINOGEN UA: NEGATIVE
pH, UA: 6

## 2013-08-24 MED ORDER — ESTRADIOL 0.5 MG/0.5GM TD GEL: 0.5000 mg | Freq: Every day | TRANSDERMAL | Status: DC

## 2013-08-24 MED ORDER — SULFAMETHOXAZOLE-TRIMETHOPRIM 800-160 MG PO TABS: 1.0000 | ORAL_TABLET | Freq: Two times a day (BID) | ORAL | Status: AC

## 2013-08-24 NOTE — Patient Instructions (Addendum)
Menopause Menopause is the normal time of life when menstrual periods stop completely. Menopause is complete when you have missed 12 consecutive menstrual periods. It usually occurs between the ages of 8 years and 48 years. Very rarely does a woman develop menopause before the age of 60 years. At menopause, your ovaries stop producing the female hormones estrogen and progesterone. This can cause undesirable symptoms and also affect your health. Sometimes the symptoms may occur 4 5 years before the menopause begins. There is no relationship between menopause and:  Oral contraceptives.  Number of children you had.  Race.  The age your menstrual periods started (menarche). Heavy smokers and very thin women may develop menopause earlier in life. CAUSES  The ovaries stop producing the female hormones estrogen and progesterone.  Other causes include:  Surgery to remove both ovaries.  The ovaries stop functioning for no known reason.  Tumors of the pituitary gland in the brain.  Medical disease that affects the ovaries and hormone production.  Radiation treatment to the abdomen or pelvis.  Chemotherapy that affects the ovaries. SYMPTOMS   Hot flashes.  Night sweats.  Decrease in sex drive.  Vaginal dryness and thinning of the vagina causing painful intercourse.  Dryness of the skin and developing wrinkles.  Headaches.  Tiredness.  Irritability.  Memory problems.  Weight gain.  Bladder infections.  Hair growth of the face and chest.  Infertility. More serious symptoms include:  Loss of bone (osteoporosis) causing breaks (fractures).  Depression.  Hardening and narrowing of the arteries (atherosclerosis) causing heart attacks and strokes. DIAGNOSIS   When the menstrual periods have stopped for 12 straight months.  Physical exam.  Hormone studies of the blood. TREATMENT  There are many treatment choices and nearly as many questions about them. The  decisions to treat or not to treat menopausal changes is an individual choice made with your health care provider. Your health care provider can discuss the treatments with you. Together, you can decide which treatment will work best for you. Your treatment choices may include:   Hormone therapy (estrogen and progesterone).  Non-hormonal medicines.  Treating the individual symptoms with medicine (for example antidepressants for depression).  Herbal medicines that may help specific symptoms.  Counseling by a psychiatrist or psychologist.  Group therapy.  Lifestyle changes including:  Eating healthy.  Regular exercise.  Limiting caffeine and alcohol.  Stress management and meditation.  No treatment. HOME CARE INSTRUCTIONS   Take the medicine your health care provider gives you as directed.  Get plenty of sleep and rest.  Exercise regularly.  Eat a diet that contains calcium (good for the bones) and soy products (acts like estrogen hormone).  Avoid alcoholic beverages.  Do not smoke.  If you have hot flashes, dress in layers.  Take supplements, calcium, and vitamin D to strengthen bones.  You can use over-the-counter lubricants or moisturizers for vaginal dryness.  Group therapy is sometimes very helpful.  Acupuncture may be helpful in some cases. SEEK MEDICAL CARE IF:   You are not sure you are in menopause.  You are having menopausal symptoms and need advice and treatment.  You are still having menstrual periods after age 73 years.  You have pain with intercourse.  Menopause is complete (no menstrual period for 12 months) and you develop vaginal bleeding.  You need a referral to a specialist (gynecologist, psychiatrist, or psychologist) for treatment. SEEK IMMEDIATE MEDICAL CARE IF:   You have severe depression.  You have excessive vaginal bleeding.  You fell and think you have a broken bone.  You have pain when you urinate.  You develop leg or  chest pain.  You have a fast pounding heart beat (palpitations).  You have severe headaches.  You develop vision problems.  You feel a lump in your breast.  You have abdominal pain or severe indigestion. Document Released: 10/20/2003 Document Revised: 04/01/2013 Document Reviewed: 02/26/2013 [facility] Patient Information 2014 [place], [place]. Urinary Tract Infection Urinary tract infections (UTIs) can develop anywhere along your urinary tract. Your urinary tract is your body's drainage system for removing wastes and extra water. Your urinary tract includes two kidneys, two ureters, a bladder, and a urethra. Your kidneys are a pair of bean-shaped organs. Each kidney is about the size of your fist. They are located below your ribs, one on each side of your spine. CAUSES Infections are caused by microbes, which are microscopic organisms, including fungi, viruses, and bacteria. These organisms are so small that they can only be seen through a microscope. Bacteria are the microbes that most commonly cause UTIs. SYMPTOMS  Symptoms of UTIs may vary by age and gender of the patient and by the location of the infection. Symptoms in young women typically include a frequent and intense urge to urinate and a painful, burning feeling in the bladder or urethra during urination. Older women and men are more likely to be tired, shaky, and weak and have muscle aches and abdominal pain. A fever may mean the infection is in your kidneys. Other symptoms of a kidney infection include pain in your back or sides below the ribs, nausea, and vomiting. DIAGNOSIS To diagnose a UTI, your caregiver will ask you about your symptoms. Your caregiver also will ask to provide a urine sample. The urine sample will be tested for bacteria and white blood cells. White blood cells are made by your body to help fight infection. TREATMENT  Typically, UTIs can be treated with medication. Because most UTIs are caused by a bacterial  infection, they usually can be treated with the use of antibiotics. The choice of antibiotic and length of treatment depend on your symptoms and the type of bacteria causing your infection. HOME CARE INSTRUCTIONS If you were prescribed antibiotics, take them exactly as your caregiver instructs you. Finish the medication even if you feel better after you have only taken some of the medication. Drink enough water and fluids to keep your urine clear or pale yellow. Avoid caffeine, tea, and carbonated beverages. They tend to irritate your bladder. Empty your bladder often. Avoid holding urine for long periods of time. Empty your bladder before and after sexual intercourse. After a bowel movement, women should cleanse from front to back. Use each tissue only once. SEEK MEDICAL CARE IF:  You have back pain. You develop a fever. Your symptoms do not begin to resolve within 3 days. SEEK IMMEDIATE MEDICAL CARE IF:  You have severe back pain or lower abdominal pain. You develop chills. You have nausea or vomiting. You have continued burning or discomfort with urination. MAKE SURE YOU:  Understand these instructions. Will watch your condition. Will get help right away if you are not doing well or get worse. Document Released: 05/09/2005 Document Revised: 01/29/2012 Document Reviewed: 09/07/2011 [facility] Patient Information 2014 [place]. Depression, Adult Depression refers to feeling sad, low, down in the dumps, blue, gloomy, or empty. In general, there are two kinds of depression: Depression that we all experience from time to time because of upsetting life experiences,  including the loss of a job or the ending of a relationship (normal sadness or normal grief). This kind of depression is considered normal, is short lived, and resolves within a few days to 2 weeks. (Depression experienced after the loss of a loved one is called bereavement. Bereavement often lasts longer than 2 weeks but  normally gets better with time.) Clinical depression, which lasts longer than normal sadness or normal grief or interferes with your ability to function at home, at work, and in school. It also interferes with your personal relationships. It affects almost every aspect of your life. Clinical depression is an illness. Symptoms of depression also can be caused by conditions other than normal sadness and grief or clinical depression. Examples of these conditions are listed as follows: Physical illness Some physical illnesses, including underactive thyroid gland (hypothyroidism), severe anemia, specific types of cancer, diabetes, uncontrolled seizures, heart and lung problems, strokes, and chronic pain are commonly associated with symptoms of depression. Side effects of some prescription medicine In some people, certain types of prescription medicine can cause symptoms of depression. Substance abuse Abuse of alcohol and illicit drugs can cause symptoms of depression. SYMPTOMS Symptoms of normal sadness and normal grief include the following: Feeling sad or crying for short periods of time. Not caring about anything (apathy). Difficulty sleeping or sleeping too much. No longer able to enjoy the things you used to enjoy. Desire to be by oneself all the time (social isolation). Lack of energy or motivation. Difficulty concentrating or remembering. Change in appetite or weight. Restlessness or agitation. Symptoms of clinical depression include the same symptoms of normal sadness or normal grief and also the following symptoms: Feeling sad or crying all the time. Feelings of guilt or worthlessness. Feelings of hopelessness or helplessness. Thoughts of suicide or the desire to harm yourself (suicidal ideation). Loss of touch with reality (psychotic symptoms). Seeing or hearing things that are not real (hallucinations) or having false beliefs about your life or the people around you (delusions and  paranoia). DIAGNOSIS  The diagnosis of clinical depression usually is based on the severity and duration of the symptoms. Your caregiver also will ask you questions about your medical history and substance use to find out if physical illness, use of prescription medicine, or substance abuse is causing your depression. Your caregiver also may order blood tests. TREATMENT  Typically, normal sadness and normal grief do not require treatment. However, sometimes antidepressant medicine is prescribed for bereavement to ease the depressive symptoms until they resolve. The treatment for clinical depression depends on the severity of your symptoms but typically includes antidepressant medicine, counseling with a mental health professional, or a combination of both. Your caregiver will help to determine what treatment is best for you. Depression caused by physical illness usually goes away with appropriate medical treatment of the illness. If prescription medicine is causing depression, talk with your caregiver about stopping the medicine, decreasing the dose, or substituting another medicine. Depression caused by abuse of alcohol or illicit drugs abuse goes away with abstinence from these substances. Some adults need professional help in order to stop drinking or using drugs. SEEK IMMEDIATE CARE IF: You have thoughts about hurting yourself or others. You lose touch with reality (have psychotic symptoms). You are taking medicine for depression and have a serious side effect. FOR MORE INFORMATION National [employer] on Mental Illness: www.nami.[employer]: https://carter.com/ Document Released: 07/27/2000 Document Revised: 01/29/2012 Document Reviewed: 10/29/2011 [facility] Patient Information 2014 [place].

## 2013-08-24 NOTE — Progress Notes (Signed)
Subjective:     Susan Osborn is a 48 y.o. female here for a exam following surgery.  Current complaints: multiple hot flashes daily, has feeling something pulling and cramping pain near incision,reports extreme mood swings for past couple of months, reports swelling to face hands, feet and legs, states she urination feels different cont to dribble after stream stops, denies incont episodes.  Personal health questionnaire reviewed: yes.   Gynecologic History No LMP recorded. Patient has had a hysterectomy. Contraception: status post hysterectomy Last Pap:2014 Last mammogram: 2014. Results were: abnormal  Obstetric History OB History  No data available     The following portions of the patient's history were reviewed and updated as appropriate: allergies, current medications, past family history, past medical history, past social history, past surgical history and problem list.  Review of Systems Pertinent items are noted in HPI.    Objective:     VULVA: normal appearing vulva with no masses, tenderness or lesions, VAGINA: normal appearing vagina with normal color and thin, white discharge, no lesions, UTERUS: surgically absent, ADNEXA: normal adnexa in size, nontender and no masses  Cuff NT Bladder slightly tender Assessment:    Dyspareunia Menopause symptoms  Plan:   Divigel Depression tool Wet prep by molecular probe Bactrim Vaginal lubricant/moisturizer Return prn or in 6 mths

## 2013-08-25 LAB — WET PREP BY MOLECULAR PROBE
Candida species: NEGATIVE
Gardnerella vaginalis: NEGATIVE
TRICHOMONAS VAG: NEGATIVE

## 2013-08-26 LAB — URINE CULTURE: Colony Count: >=100000

## 2014-02-22 ENCOUNTER — Ambulatory Visit: Payer: PRIVATE HEALTH INSURANCE | Admitting: Obstetrics & Gynecology

## 2014-08-09 ENCOUNTER — Encounter: Payer: Self-pay | Admitting: *Deleted

## 2014-08-10 ENCOUNTER — Encounter: Payer: Self-pay | Admitting: Obstetrics & Gynecology

## 2015-06-17 ENCOUNTER — Emergency Department (HOSPITAL_COMMUNITY)
Admission: EM | Admit: 2015-06-17 | Discharge: 2015-06-17 | Disposition: A | Discharge status: Home / Self Care | Payer: No Typology Code available for payment source | Attending: Emergency Medicine | Admitting: Emergency Medicine

## 2015-06-17 ENCOUNTER — Emergency Department (HOSPITAL_COMMUNITY): Payer: No Typology Code available for payment source

## 2015-06-17 ENCOUNTER — Encounter (HOSPITAL_COMMUNITY): Payer: Self-pay

## 2015-06-17 DIAGNOSIS — Z87891 Personal history of nicotine dependence: Secondary | ICD-10-CM | POA: Diagnosis not present

## 2015-06-17 DIAGNOSIS — M549 Dorsalgia, unspecified: Secondary | ICD-10-CM

## 2015-06-17 DIAGNOSIS — S199XXA Unspecified injury of neck, initial encounter: Secondary | ICD-10-CM | POA: Insufficient documentation

## 2015-06-17 DIAGNOSIS — Y92410 Unspecified street and highway as the place of occurrence of the external cause: Secondary | ICD-10-CM | POA: Insufficient documentation

## 2015-06-17 DIAGNOSIS — Z862 Personal history of diseases of the blood and blood-forming organs and certain disorders involving the immune mechanism: Secondary | ICD-10-CM | POA: Insufficient documentation

## 2015-06-17 DIAGNOSIS — Y9389 Activity, other specified: Secondary | ICD-10-CM | POA: Diagnosis not present

## 2015-06-17 DIAGNOSIS — Z8543 Personal history of malignant neoplasm of ovary: Secondary | ICD-10-CM | POA: Diagnosis not present

## 2015-06-17 DIAGNOSIS — S59911A Unspecified injury of right forearm, initial encounter: Secondary | ICD-10-CM | POA: Insufficient documentation

## 2015-06-17 DIAGNOSIS — S299XXA Unspecified injury of thorax, initial encounter: Secondary | ICD-10-CM | POA: Insufficient documentation

## 2015-06-17 DIAGNOSIS — Y998 Other external cause status: Secondary | ICD-10-CM | POA: Insufficient documentation

## 2015-06-17 DIAGNOSIS — M542 Cervicalgia: Secondary | ICD-10-CM

## 2015-06-17 HISTORY — DX: Malignant (primary) neoplasm, unspecified: C80.1

## 2015-06-17 MED ORDER — ACETAMINOPHEN 500 MG PO TABS
1000.0000 mg | ORAL_TABLET | Freq: Once | ORAL | Status: AC
  Administered 2015-06-17: 1000 mg via ORAL
  Filled 2015-06-17: qty 2

## 2015-06-17 MED ORDER — PROPARACAINE HCL 0.5 % OP SOLN
2.0000 [drp] | Freq: Once | OPHTHALMIC | Status: AC
  Administered 2015-06-17: 2 [drp] via OPHTHALMIC
  Filled 2015-06-17: qty 15

## 2015-06-17 MED ORDER — CYCLOBENZAPRINE HCL 10 MG PO TABS: 10.0000 mg | ORAL_TABLET | Freq: Two times a day (BID) | ORAL | Status: DC | PRN

## 2015-06-17 MED ORDER — FLUORESCEIN SODIUM 1 MG OP STRP
1.0000 | ORAL_STRIP | Freq: Once | OPHTHALMIC | Status: AC
  Administered 2015-06-17: 1 via OPHTHALMIC
  Filled 2015-06-17: qty 1

## 2015-06-17 MED ORDER — CYCLOBENZAPRINE HCL 10 MG PO TABS
10.0000 mg | ORAL_TABLET | Freq: Once | ORAL | Status: AC
  Administered 2015-06-17: 10 mg via ORAL
  Filled 2015-06-17: qty 1
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
## 2015-06-17 NOTE — ED Notes (Signed)
C/o upper back and neck pain

## 2015-06-17 NOTE — Discharge Instructions (Signed)
X-rays showed no fracture. He'll be sore for several days. Tylenol or ibuprofen for pain. Prescription for muscle relaxer.

## 2015-06-17 NOTE — ED Notes (Signed)
Patient verbalizes understanding of discharge instructions, prescription medications, home care and follow up care. Patient ambulatory out of department at this time with family member. 

## 2015-06-26 ENCOUNTER — Encounter (HOSPITAL_COMMUNITY): Payer: Self-pay | Admitting: Emergency Medicine

## 2015-06-26 ENCOUNTER — Emergency Department (HOSPITAL_COMMUNITY)
Admission: EM | Admit: 2015-06-26 | Discharge: 2015-06-27 | Disposition: A | Discharge status: Home / Self Care | Payer: No Typology Code available for payment source | Attending: Emergency Medicine | Admitting: Emergency Medicine

## 2015-06-26 ENCOUNTER — Emergency Department (HOSPITAL_COMMUNITY): Payer: No Typology Code available for payment source

## 2015-06-26 DIAGNOSIS — N760 Acute vaginitis: Secondary | ICD-10-CM | POA: Insufficient documentation

## 2015-06-26 DIAGNOSIS — Z87891 Personal history of nicotine dependence: Secondary | ICD-10-CM | POA: Diagnosis not present

## 2015-06-26 DIAGNOSIS — Z90710 Acquired absence of both cervix and uterus: Secondary | ICD-10-CM | POA: Diagnosis not present

## 2015-06-26 DIAGNOSIS — M542 Cervicalgia: Secondary | ICD-10-CM | POA: Diagnosis not present

## 2015-06-26 DIAGNOSIS — Z862 Personal history of diseases of the blood and blood-forming organs and certain disorders involving the immune mechanism: Secondary | ICD-10-CM | POA: Diagnosis not present

## 2015-06-26 DIAGNOSIS — Z3202 Encounter for pregnancy test, result negative: Secondary | ICD-10-CM | POA: Diagnosis not present

## 2015-06-26 DIAGNOSIS — R102 Pelvic and perineal pain: Secondary | ICD-10-CM | POA: Diagnosis present

## 2015-06-26 DIAGNOSIS — Z87828 Personal history of other (healed) physical injury and trauma: Secondary | ICD-10-CM | POA: Diagnosis not present

## 2015-06-26 DIAGNOSIS — A599 Trichomoniasis, unspecified: Secondary | ICD-10-CM

## 2015-06-26 DIAGNOSIS — Z8543 Personal history of malignant neoplasm of ovary: Secondary | ICD-10-CM | POA: Insufficient documentation

## 2015-06-26 DIAGNOSIS — A5901 Trichomonal vulvovaginitis: Secondary | ICD-10-CM | POA: Diagnosis not present

## 2015-06-26 DIAGNOSIS — B9689 Other specified bacterial agents as the cause of diseases classified elsewhere: Secondary | ICD-10-CM

## 2015-06-26 LAB — COMPREHENSIVE METABOLIC PANEL
ALT: 18 U/L (ref 14–54)
ANION GAP: 8 (ref 5–15)
AST: 22 U/L (ref 15–41)
Albumin: 4.1 g/dL (ref 3.5–5.0)
Alkaline Phosphatase: 92 U/L (ref 38–126)
BUN: 11 mg/dL (ref 6–20)
CHLORIDE: 102 mmol/L (ref 101–111)
CO2: 29 mmol/L (ref 22–32)
Calcium: 9.5 mg/dL (ref 8.9–10.3)
Creatinine, Ser: 0.83 mg/dL (ref 0.44–1.00)
Glucose, Bld: 100 mg/dL — ABNORMAL HIGH (ref 65–99)
POTASSIUM: 4.1 mmol/L (ref 3.5–5.1)
Sodium: 139 mmol/L (ref 135–145)
Total Bilirubin: 0.3 mg/dL (ref 0.3–1.2)
Total Protein: 8.4 g/dL — ABNORMAL HIGH (ref 6.5–8.1)

## 2015-06-26 LAB — CBC WITH DIFFERENTIAL/PLATELET
Basophils Absolute: 0 10*3/uL (ref 0.0–0.1)
Basophils Relative: 0 %
Eosinophils Absolute: 0.1 10*3/uL (ref 0.0–0.7)
Eosinophils Relative: 2 %
HCT: 45.1 % (ref 36.0–46.0)
Hemoglobin: 15 g/dL (ref 12.0–15.0)
Lymphocytes Relative: 54 %
Lymphs Abs: 2.9 10*3/uL (ref 0.7–4.0)
MCH: 30.2 pg (ref 26.0–34.0)
MCHC: 33.3 g/dL (ref 30.0–36.0)
MCV: 90.9 fL (ref 78.0–100.0)
Monocytes Absolute: 0.2 10*3/uL (ref 0.1–1.0)
Monocytes Relative: 4 %
Neutro Abs: 2.2 10*3/uL (ref 1.7–7.7)
Neutrophils Relative %: 40 %
Platelets: 218 10*3/uL (ref 150–400)
RBC: 4.96 MIL/uL (ref 3.87–5.11)
RDW: 13.5 % (ref 11.5–15.5)
WBC: 5.5 10*3/uL (ref 4.0–10.5)

## 2015-06-26 LAB — LIPASE, BLOOD: LIPASE: 22 U/L (ref 11–51)

## 2015-06-26 LAB — URINALYSIS W MICROSCOPIC (NOT AT ARMC)
BILIRUBIN URINE: NEGATIVE
GLUCOSE, UA: NEGATIVE mg/dL
KETONES UR: NEGATIVE mg/dL
LEUKOCYTES UA: NEGATIVE
NITRITE: NEGATIVE
PH: 6.5 (ref 5.0–8.0)
Protein, ur: NEGATIVE mg/dL
SPECIFIC GRAVITY, URINE: 1.018 (ref 1.005–1.030)
Urobilinogen, UA: 1 mg/dL (ref 0.0–1.0)

## 2015-06-26 LAB — WET PREP, GENITAL: Yeast Wet Prep HPF POC: NONE SEEN

## 2015-06-26 LAB — I-STAT BETA HCG BLOOD, ED (MC, WL, AP ONLY): I-stat hCG, quantitative: <5 m[IU]/mL

## 2015-06-26 MED ORDER — DEXTROSE 5 % IV SOLN: 1.0000 g | Freq: Once | INTRAVENOUS | Status: DC

## 2015-06-26 MED ORDER — DEXTROSE 5 % IV SOLN: 250.0000 mg | Freq: Once | INTRAVENOUS | Status: DC

## 2015-06-26 MED ORDER — KETOROLAC TROMETHAMINE 30 MG/ML IJ SOLN
30.0000 mg | Freq: Once | INTRAMUSCULAR | Status: AC
  Administered 2015-06-26: 30 mg via INTRAVENOUS
  Filled 2015-06-26: qty 1

## 2015-06-26 MED ORDER — AZITHROMYCIN 250 MG PO TABS
1000.0000 mg | ORAL_TABLET | Freq: Once | ORAL | Status: AC
  Administered 2015-06-26: 1000 mg via ORAL
  Filled 2015-06-26: qty 4

## 2015-06-26 MED ORDER — DIAZEPAM 5 MG/ML IJ SOLN
5.0000 mg | Freq: Once | INTRAMUSCULAR | Status: AC
  Administered 2015-06-26: 5 mg via INTRAVENOUS
  Filled 2015-06-26: qty 2

## 2015-06-26 MED ORDER — METRONIDAZOLE 500 MG PO TABS: 500.0000 mg | ORAL_TABLET | Freq: Two times a day (BID) | ORAL | Status: DC

## 2015-06-26 MED ORDER — CEFTRIAXONE SODIUM 1 G IJ SOLR
1.0000 g | Freq: Once | INTRAMUSCULAR | Status: AC
  Administered 2015-06-26: 1 g via INTRAMUSCULAR
  Filled 2015-06-26: qty 10

## 2015-06-26 NOTE — ED Notes (Signed)
Patient not in room yet 
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
## 2015-06-26 NOTE — Discharge Instructions (Signed)
Return without fail for worsening symptoms, including fevers, worsening pain, vomiting and unable to keep down food/fluids, or any other symptom concerning to you. Take antibiotics as prescribed.   Bacterial Vaginosis Bacterial vaginosis is a vaginal infection that occurs when the normal balance of bacteria in the vagina is disrupted. It results from an overgrowth of certain bacteria. This is the most common vaginal infection in women of childbearing age. Treatment is important to prevent complications, especially in pregnant women, as it can cause a premature delivery. CAUSES  Bacterial vaginosis is caused by an increase in harmful bacteria that are normally present in smaller amounts in the vagina. Several different kinds of bacteria can cause bacterial vaginosis. However, the reason that the condition develops is not fully understood. RISK FACTORS Certain activities or behaviors can put you at an increased risk of developing bacterial vaginosis, including:  Having a new sex partner or multiple sex partners.  Douching.  Using an intrauterine device (IUD) for contraception. Women do not get bacterial vaginosis from toilet seats, bedding, swimming pools, or contact with objects around them. SIGNS AND SYMPTOMS  Some women with bacterial vaginosis have no signs or symptoms. Common symptoms include:  Grey vaginal discharge.  A fishlike odor with discharge, especially after sexual intercourse.  Itching or burning of the vagina and vulva.  Burning or pain with urination. DIAGNOSIS  Your health care provider will take a medical history and examine the vagina for signs of bacterial vaginosis. A sample of vaginal fluid may be taken. Your health care provider will look at this sample under a microscope to check for bacteria and abnormal cells. A vaginal pH test may also be done.  TREATMENT  Bacterial vaginosis may be treated with antibiotic medicines. These may be given in the form of a pill or a  vaginal cream. A second round of antibiotics may be prescribed if the condition comes back after treatment. Because bacterial vaginosis increases your risk for sexually transmitted diseases, getting treated can help reduce your risk for chlamydia, gonorrhea, HIV, and herpes. HOME CARE INSTRUCTIONS   Only take over-the-counter or prescription medicines as directed by your health care provider.  If antibiotic medicine was prescribed, take it as directed. Make sure you finish it even if you start to feel better.  Tell all sexual partners that you have a vaginal infection. They should see their health care provider and be treated if they have problems, such as a mild rash or itching.  During treatment, it is important that you follow these instructions:  Avoid sexual activity or use condoms correctly.  Do not douche.  Avoid alcohol as directed by your health care provider.  Avoid breastfeeding as directed by your health care provider. SEEK MEDICAL CARE IF:   Your symptoms are not improving after 3 days of treatment.  You have increased discharge or pain.  You have a fever. MAKE SURE YOU:   Understand these instructions.  Will watch your condition.  Will get help right away if you are not doing well or get worse. FOR MORE INFORMATION  Centers for Disease Control and Prevention, Division of STD Prevention: [employer] American Sexual Health Association (ASHA): www.ashastd.org    This information is not intended to replace advice given to you by your health care provider. Make sure you discuss any questions you have with your health care provider.   Document Released: 07/30/2005 Document Revised: 08/20/2014 Document Reviewed: 03/11/2013 Elsevier Interactive Patient Education 2016 [employer].  Sexually Transmitted Disease A sexually transmitted  disease (STD) is a disease or infection that may be passed (transmitted) from person to person, usually during sexual activity. This  may happen by way of saliva, semen, blood, vaginal mucus, or urine. Common STDs include:  Gonorrhea.  Chlamydia.  Syphilis.  HIV and AIDS.  Genital herpes.  Hepatitis B and C.  Trichomonas.  Human papillomavirus (HPV).  Pubic lice.  Scabies.  Mites.  Bacterial vaginosis. WHAT ARE CAUSES OF STDs? An STD may be caused by bacteria, a virus, or parasites. STDs are often transmitted during sexual activity if one person is infected. However, they may also be transmitted through nonsexual means. STDs may be transmitted after:   Sexual intercourse with an infected person.  Sharing sex toys with an infected person.  Sharing needles with an infected person or using unclean piercing or tattoo needles.  Having intimate contact with the genitals, mouth, or rectal areas of an infected person.  Exposure to infected fluids during birth. WHAT ARE THE SIGNS AND SYMPTOMS OF STDs? Different STDs have different symptoms. Some people may not have any symptoms. If symptoms are present, they may include:  Painful or bloody urination.  Pain in the pelvis, abdomen, vagina, anus, throat, or eyes.  A skin rash, itching, or irritation.  Growths, ulcerations, blisters, or sores in the genital and anal areas.  Abnormal vaginal discharge with or without bad odor.  Penile discharge in men.  Fever.  Pain or bleeding during sexual intercourse.  Swollen glands in the groin area.  Yellow skin and eyes (jaundice). This is seen with hepatitis.  Swollen testicles.  Infertility.  Sores and blisters in the mouth. HOW ARE STDs DIAGNOSED? To make a diagnosis, your health care provider may:  Take a medical history.  Perform a physical exam.  Take a sample of any discharge to examine.  Swab the throat, cervix, opening to the penis, rectum, or vagina for testing.  Test a sample of your first morning urine.  Perform blood tests.  Perform a Pap test, if this applies.  Perform a  colposcopy.  Perform a laparoscopy. HOW ARE STDs TREATED? Treatment depends on the STD. Some STDs may be treated but not cured.  Chlamydia, gonorrhea, trichomonas, and syphilis can be cured with antibiotic medicine.  Genital herpes, hepatitis, and HIV can be treated, but not cured, with prescribed medicines. The medicines lessen symptoms.  Genital warts from HPV can be treated with medicine or by freezing, burning (electrocautery), or surgery. Warts may come back.  HPV cannot be cured with medicine or surgery. However, abnormal areas may be removed from the cervix, vagina, or vulva.  If your diagnosis is confirmed, your recent sexual partners need treatment. This is true even if they are symptom-free or have a negative culture or evaluation. They should not have sex until their health care providers say it is okay.  Your health care provider may test you for infection again 3 months after treatment. HOW CAN I REDUCE MY RISK OF GETTING AN STD? Take these steps to reduce your risk of getting an STD:  Use latex condoms, dental dams, and water-soluble lubricants during sexual activity. Do not use petroleum jelly or oils.  Avoid having multiple sex partners.  Do not have sex with someone who has other sex partners  Do not have sex with anyone you do not know or who is at high risk for an STD.  Avoid risky sex practices that can break your skin.  Do not have sex if you have open sores  on your mouth or skin.  Avoid drinking too much alcohol or taking illegal drugs. Alcohol and drugs can affect your judgment and put you in a vulnerable position.  Avoid engaging in oral and anal sex acts.  Get vaccinated for HPV and hepatitis. If you have not received these vaccines in the past, talk to your health care provider about whether one or both might be right for you.  If you are at risk of being infected with HIV, it is recommended that you take a prescription medicine daily to prevent HIV  infection. This is called pre-exposure prophylaxis (PrEP). You are considered at risk if:  You are a man who has sex with other men (MSM).  You are a heterosexual man or woman and are sexually active with more than one partner.  You take drugs by injection.  You are sexually active with a partner who has HIV.  Talk with your health care provider about whether you are at high risk of being infected with HIV. If you choose to begin PrEP, you should first be tested for HIV. You should then be tested every 3 months for as long as you are taking PrEP. WHAT SHOULD I DO IF I THINK I HAVE AN STD?  See your health care provider.  Tell your sexual partner(s). They should be tested and treated for any STDs.  Do not have sex until your health care provider says it is okay. WHEN SHOULD I GET IMMEDIATE MEDICAL CARE? Contact your health care provider right away if:   You have severe abdominal pain.  You are a man and notice swelling or pain in your testicles.  You are a woman and notice swelling or pain in your vagina.   This information is not intended to replace advice given to you by your health care provider. Make sure you discuss any questions you have with your health care provider.   Document Released: 10/20/2002 Document Revised: 08/20/2014 Document Reviewed: 02/17/2013 Elsevier Interactive Patient Education 2016 [employer].  Trichomoniasis Trichomoniasis is an infection caused by an organism called Trichomonas. The infection can affect both women and men. In women, the outer female genitalia and the vagina are affected. In men, the penis is mainly affected, but the prostate and other reproductive organs can also be involved. Trichomoniasis is a sexually transmitted infection (STI) and is most often passed to another person through sexual contact.  RISK FACTORS  Having unprotected sexual intercourse.  Having sexual intercourse with an infected partner. SIGNS AND SYMPTOMS  Symptoms  of trichomoniasis in women include:  Abnormal gray-green frothy vaginal discharge.  Itching and irritation of the vagina.  Itching and irritation of the area outside the vagina. Symptoms of trichomoniasis in men include:   Penile discharge with or without pain.  Pain during urination. This results from inflammation of the urethra. DIAGNOSIS  Trichomoniasis may be found during a Pap test or physical exam. Your health care provider may use one of the following methods to help diagnose this infection:  Testing the pH of the vagina with a test tape.  Using a vaginal swab test that checks for the Trichomonas organism. A test is available that provides results within a few minutes.  Examining a urine sample.  Testing vaginal secretions. Your health care provider may test you for other STIs, including HIV. TREATMENT   You may be given medicine to fight the infection. Women should inform their health care provider if they could be or are pregnant. Some medicines used  to treat the infection should not be taken during pregnancy.  Your health care provider may recommend over-the-counter medicines or creams to decrease itching or irritation.  Your sexual partner will need to be treated if infected.  Your health care provider may test you for infection again 3 months after treatment. HOME CARE INSTRUCTIONS   Take medicines only as directed by your health care provider.  Take over-the-counter medicine for itching or irritation as directed by your health care provider.  Do not have sexual intercourse while you have the infection.  Women should not douche or wear tampons while they have the infection.  Discuss your infection with your partner. Your partner may have gotten the infection from you, or you may have gotten it from your partner.  Have your sex partner get examined and treated if necessary.  Practice safe, informed, and protected sex.  See your health care provider for other  STI testing. SEEK MEDICAL CARE IF:   You still have symptoms after you finish your medicine.  You develop abdominal pain.  You have pain when you urinate.  You have bleeding after sexual intercourse.  You develop a rash.  Your medicine makes you sick or makes you throw up (vomit). MAKE SURE YOU:  Understand these instructions.  Will watch your condition.  Will get help right away if you are not doing well or get worse.   This information is not intended to replace advice given to you by your health care provider. Make sure you discuss any questions you have with your health care provider.   Document Released: 01/23/2001 Document Revised: 08/20/2014 Document Reviewed: 05/11/2013 Elsevier Interactive Patient Education [employer].

## 2015-06-26 NOTE — ED Notes (Signed)
Talked with Dr. Oleta Mouse about rocephin and she agreed to route change to IM.
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
## 2015-12-22 ENCOUNTER — Emergency Department (HOSPITAL_COMMUNITY)
Admission: EM | Admit: 2015-12-22 | Discharge: 2015-12-23 | Disposition: A | Discharge status: Home / Self Care | Payer: Self-pay | Attending: Emergency Medicine | Admitting: Emergency Medicine

## 2015-12-22 ENCOUNTER — Encounter (HOSPITAL_COMMUNITY): Payer: Self-pay | Admitting: Emergency Medicine

## 2015-12-22 DIAGNOSIS — Z9889 Other specified postprocedural states: Secondary | ICD-10-CM | POA: Insufficient documentation

## 2015-12-22 DIAGNOSIS — Z87891 Personal history of nicotine dependence: Secondary | ICD-10-CM | POA: Insufficient documentation

## 2015-12-22 DIAGNOSIS — R1032 Left lower quadrant pain: Secondary | ICD-10-CM

## 2015-12-22 DIAGNOSIS — Z8742 Personal history of other diseases of the female genital tract: Secondary | ICD-10-CM | POA: Insufficient documentation

## 2015-12-22 DIAGNOSIS — N390 Urinary tract infection, site not specified: Secondary | ICD-10-CM | POA: Insufficient documentation

## 2015-12-22 DIAGNOSIS — Z8543 Personal history of malignant neoplasm of ovary: Secondary | ICD-10-CM | POA: Insufficient documentation

## 2015-12-22 DIAGNOSIS — Z862 Personal history of diseases of the blood and blood-forming organs and certain disorders involving the immune mechanism: Secondary | ICD-10-CM | POA: Insufficient documentation

## 2015-12-22 DIAGNOSIS — Z90710 Acquired absence of both cervix and uterus: Secondary | ICD-10-CM | POA: Insufficient documentation

## 2015-12-22 LAB — URINALYSIS, ROUTINE W REFLEX MICROSCOPIC
BILIRUBIN URINE: NEGATIVE
GLUCOSE, UA: NEGATIVE mg/dL
Ketones, ur: NEGATIVE mg/dL
Nitrite: NEGATIVE
PH: 6.5 (ref 5.0–8.0)
Protein, ur: NEGATIVE mg/dL
SPECIFIC GRAVITY, URINE: 1.015 (ref 1.005–1.030)

## 2015-12-22 LAB — COMPREHENSIVE METABOLIC PANEL
ALBUMIN: 3.5 g/dL (ref 3.5–5.0)
ALK PHOS: 75 U/L (ref 38–126)
ALT: 18 U/L (ref 14–54)
ANION GAP: 11 (ref 5–15)
AST: 20 U/L (ref 15–41)
BUN: 11 mg/dL (ref 6–20)
CALCIUM: 9.3 mg/dL (ref 8.9–10.3)
CHLORIDE: 102 mmol/L (ref 101–111)
CO2: 27 mmol/L (ref 22–32)
Creatinine, Ser: 0.93 mg/dL (ref 0.44–1.00)
GFR calc Af Amer: >60 mL/min (ref >60)
GFR calc non Af Amer: >60 mL/min (ref >60)
GLUCOSE: 118 mg/dL — AB (ref 65–99)
Potassium: 4.1 mmol/L (ref 3.5–5.1)
SODIUM: 140 mmol/L (ref 135–145)
Total Bilirubin: 0.6 mg/dL (ref 0.3–1.2)
Total Protein: 7.1 g/dL (ref 6.5–8.1)

## 2015-12-22 LAB — CBC
HEMATOCRIT: 37.2 % (ref 36.0–46.0)
HEMOGLOBIN: 12.1 g/dL (ref 12.0–15.0)
MCH: 29.3 pg (ref 26.0–34.0)
MCHC: 32.5 g/dL (ref 30.0–36.0)
MCV: 90.1 fL (ref 78.0–100.0)
Platelets: 215 10*3/uL (ref 150–400)
RBC: 4.13 MIL/uL (ref 3.87–5.11)
RDW: 13.4 % (ref 11.5–15.5)
WBC: 5.6 10*3/uL (ref 4.0–10.5)

## 2015-12-22 LAB — URINE MICROSCOPIC-ADD ON

## 2015-12-22 LAB — LIPASE, BLOOD: Lipase: 21 U/L (ref 11–51)

## 2015-12-22 MED ORDER — ONDANSETRON HCL 4 MG/2ML IJ SOLN
4.0000 mg | Freq: Once | INTRAMUSCULAR | Status: AC
  Administered 2015-12-22: 4 mg via INTRAVENOUS
  Filled 2015-12-22: qty 2

## 2015-12-22 MED ORDER — MORPHINE SULFATE (PF) 4 MG/ML IV SOLN
4.0000 mg | Freq: Once | INTRAVENOUS | Status: AC
  Administered 2015-12-22: 4 mg via INTRAVENOUS
  Filled 2015-12-22: qty 1

## 2015-12-22 MED ORDER — DIATRIZOATE MEGLUMINE & SODIUM 66-10 % PO SOLN
ORAL | Status: AC
  Filled 2015-12-22: qty 30

## 2015-12-22 NOTE — ED Notes (Signed)
Pt. reports mid / left abdominal pain radiating to left lower back onset last week with mild nausea and occasional diarrhea , pt. added mild dysuria and urinary frequency . Denies fever .

## 2015-12-22 NOTE — ED Provider Notes (Signed)
CSN: [id number]     Arrival date & time 12/22/15  1848 History   First MD Initiated Contact with Patient 12/22/15 2050     Chief Complaint  Patient presents with  . Abdominal Pain     (Consider location/radiation/quality/duration/timing/severity/associated sxs/prior Treatment) HPI 50 year old female who presents with abdominal pain. She has a history of endometriosis, partial abdominal hysterectomy with left salpingo-oophorectomy, cesarean sections 3. States 4-5 days of initially intermittent left lower quadrant abdominal pain, that has become more persistent and throbbing over the past 2 days. Associated with nausea but no vomiting. States that prior to that she has been very constipated and has taken magnesium citrate, now having diarrhea. Also notes associated left flank pain with dysuria and urinary frequency. No hematuria. Past Medical History  Diagnosis Date  . Anemia   . Pelvic mass in female   . Headache(784.0)     hx of related to anemia   . Cancer ([place])     ovarian   Past Surgical History  Procedure Laterality Date  . Cesarean section      x3  . Abdominal hysterectomy  1998    partial  . Laparotomy Left 01/13/2013    Procedure: EXPLORATORY LAPAROTOMY,BILATERAL SALPINGO OOPHORECTOMY, Left ureterolysis,Lysis of adhesions;  Surgeon: Alvino Chapel, MD;  Location: [facility];  Service: Gynecology;  Laterality: Left;   Family History  Problem Relation Age of Onset  . Breast cancer Mother   . Stomach cancer Mother   . Emphysema Father   . Kidney failure Brother   . Pancreatic cancer Maternal Aunt   . Stomach cancer Paternal Grandfather    Social History  Substance Use Topics  . Smoking status: Former Smoker    Quit date: 12/12/2010  . Smokeless tobacco: Never Used  . Alcohol Use: Yes     Comment: occas   OB History    No data available     Review of Systems 10/14 systems reviewed and are negative other than those stated in the HPI   Allergies  Review of  patient's allergies indicates no known allergies.  Home Medications   Prior to Admission medications   Medication Sig Start Date End Date Taking? Authorizing Provider  cyclobenzaprine (FLEXERIL) 10 MG tablet Take 1 tablet (10 mg total) by mouth 2 (two) times daily as needed for muscle spasms. Patient not taking: Reported on 12/22/2015 06/17/15   Nat Christen, MD  metroNIDAZOLE (FLAGYL) 500 MG tablet Take 1 tablet (500 mg total) by mouth 2 (two) times daily. Patient not taking: Reported on 12/22/2015 06/26/15   Forde Dandy, MD   BP 119/76 mmHg  Pulse 49  Temp(Src) 98.8 F (37.1 C) (Oral)  Resp 15  Ht 5\' 2"  (1.575 m)  Wt 128 lb (58.06 kg)  BMI 23.41 kg/m2  SpO2 97% Physical Exam Physical Exam  Nursing note and vitals reviewed. Constitutional: Well developed, well nourished, non-toxic, and in no acute distress Head: Normocephalic and atraumatic.  Mouth/Throat: Oropharynx is clear and moist.  Neck: Normal range of motion. Neck supple.  Cardiovascular: Normal rate and regular rhythm.   Pulmonary/Chest: Effort normal and breath sounds normal.  Abdominal: Soft. There is LLQ and left CVA tenderness. There is no rebound and no guarding.  Musculoskeletal: Normal range of motion.  Neurological: Alert, no facial droop, fluent speech, moves all extremities symmetrically Skin: Skin is warm and dry.  Psychiatric: Cooperative  ED Course  Procedures (including critical care time) Labs Review Labs Reviewed  COMPREHENSIVE METABOLIC PANEL - Abnormal; Notable  for the following:    Glucose, Bld 118 (*)    All other components within normal limits  URINALYSIS, ROUTINE W REFLEX MICROSCOPIC (NOT AT [facility]) - Abnormal; Notable for the following:    Hgb urine dipstick MODERATE (*)    Leukocytes, UA SMALL (*)    All other components within normal limits  URINE MICROSCOPIC-ADD ON - Abnormal; Notable for the following:    Squamous Epithelial / LPF 0-5 (*)    Bacteria, UA FEW (*)    All other components  within normal limits  URINE CULTURE  LIPASE, BLOOD  CBC    Imaging Review No results found. I have personally reviewed and evaluated these images and lab results as part of my medical decision-making.   EKG Interpretation None      MDM   Final diagnoses:  LLQ abdominal pain    50 year old female with abdominal hysterectomy and left salpingooopherectomy who presents with left lower quadrant abdominal pain over the past 4-5 days. On presentation is nontoxic and in no acute distress. Her vital signs are stable. She overall has a soft and nonsurgical abdomen with left lower quadrant tenderness to palpation. Some left CVA tenderness is also noted in with her urinary symptoms suspected possible pyelonephritis. However, UA is not very impressive for that of a UTI with few bacteria and small leukocytes. We'll obtain CT abdomen pelvis to rule out acute diverticulitis or other intra-abdominal process. This is pending and signed out to oncoming physician, Dr. Lossie Faes. If CT overall unremarkable given her symptoms of flank pain, urinary frequency, dysuria, we'll treat for pyelonephritis as an outpatient with urine culture pending.    Forde Dandy, MD 12/23/15 [phone]

## 2015-12-23 ENCOUNTER — Emergency Department (HOSPITAL_COMMUNITY): Payer: Self-pay

## 2015-12-23 MED ORDER — CEPHALEXIN 500 MG PO CAPS: 500.0000 mg | ORAL_CAPSULE | Freq: Two times a day (BID) | ORAL | Status: AC

## 2015-12-23 MED ORDER — CEPHALEXIN 250 MG PO CAPS
500.0000 mg | ORAL_CAPSULE | Freq: Once | ORAL | Status: AC
Start: 2015-12-23 — End: 2015-12-23
  Administered 2015-12-23: 500 mg via ORAL
  Filled 2015-12-23: qty 2

## 2015-12-23 NOTE — ED Notes (Signed)
Pt back from CT

## 2015-12-23 NOTE — Discharge Instructions (Signed)
Urinary Tract Infection Susan Osborn, take antibiotics as directed for your urinary infection.  See a primary care doctor within 3 days for close follow up.  If symptoms worsen, come back to the ED immediately. Thank you.   A urinary tract infection (UTI) can occur any place along the urinary tract. The tract includes the kidneys, ureters, bladder, and urethra. A type of germ called bacteria often causes a UTI. UTIs are often helped with antibiotic medicine.  HOME CARE   If given, take antibiotics as told by your doctor. Finish them even if you start to feel better.  Drink enough fluids to keep your pee (urine) clear or pale yellow.  Avoid tea, drinks with caffeine, and bubbly (carbonated) drinks.  Pee often. Avoid holding your pee in for a long time.  Pee before and after having sex (intercourse).  Wipe from front to back after you poop (bowel movement) if you are a woman. Use each tissue only once. GET HELP RIGHT AWAY IF:   You have back pain.  You have lower belly (abdominal) pain.  You have chills.  You feel sick to your stomach (nauseous).  You throw up (vomit).  Your burning or discomfort with peeing does not go away.  You have a fever.  Your symptoms are not better in 3 days. MAKE SURE YOU:   Understand these instructions.  Will watch your condition.  Will get help right away if you are not doing well or get worse.   This information is not intended to replace advice given to you by your health care provider. Make sure you discuss any questions you have with your health care provider.   Document Released: 01/16/2008 Document Revised: 08/20/2014 Document Reviewed: 02/28/2012 Elsevier Interactive Patient Education [employer].

## 2015-12-23 NOTE — ED Notes (Signed)
Pt to CT

## 2015-12-23 NOTE — ED Provider Notes (Signed)
I was singed out patient as pending CT.  Ct reveals possible cystitis, which Dr. Laurena Spies was concerned for as well.  Will give dose of keflex in the ED and Dc with 7 day treatment.  PCP fu advised within 3 days.  She appears well and in NAD. VS remain within her normal limits and she is safe for DC.  Everlene Balls, MD 12/23/15 0120

## 2015-12-25 LAB — URINE CULTURE: Culture: >=100000 — AB

## 2015-12-26 ENCOUNTER — Telehealth (HOSPITAL_BASED_OUTPATIENT_CLINIC_OR_DEPARTMENT_OTHER): Payer: Self-pay | Admitting: Emergency Medicine

## 2015-12-26 NOTE — Telephone Encounter (Signed)
Post ED Visit - Positive Culture Follow-up  Culture report reviewed by antimicrobial stewardship pharmacist:  []  Elenor Quinones, Pharm.D. []  Heide Guile, Pharm.D., BCPS []  Parks Neptune, Pharm.D. [x]  Alycia Rossetti, Pharm.D., BCPS []  [place], Pharm.D., BCPS, AAHIVP []  Legrand Como, Pharm.D., BCPS, AAHIVP []  Milus Glazier, Pharm.D. []  Stephens November, [place].D.  Positive urine culture Treated with cephalexin, organism sensitive to the same and no further patient follow-up is required at this time.  Hazle Nordmann 12/26/2015, 12:16 PM

## 2016-10-25 IMAGING — US US PELVIS COMPLETE
1 series · 14 of 25 positions shown · non-contrast
Comparison: CT of the abdomen and pelvis from 11/16/2012

CLINICAL DATA: Acute onset of pelvic pain.  Initial encounter.

EXAM:
TRANSABDOMINAL AND TRANSVAGINAL ULTRASOUND OF PELVIS
TECHNIQUE: Both transabdominal and transvaginal ultrasound examinations of the
pelvis were performed. Transabdominal technique was performed for
global imaging of the pelvis including uterus, ovaries, adnexal
regions, and pelvic cul-de-sac. It was necessary to proceed with
endovaginal exam following the transabdominal exam to visualize the
right ovary.

[Series 1: us pelvis complete · 0.14mm/px · 14 of 37 slices shown]
[im 1/37]
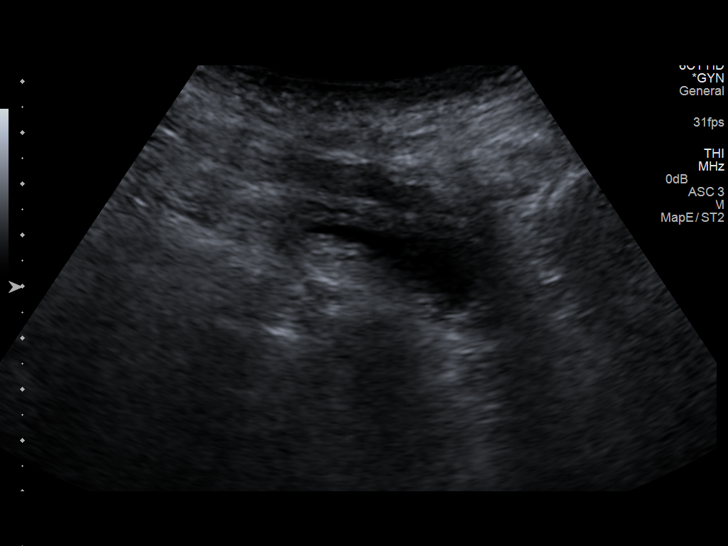
[im 4/37]
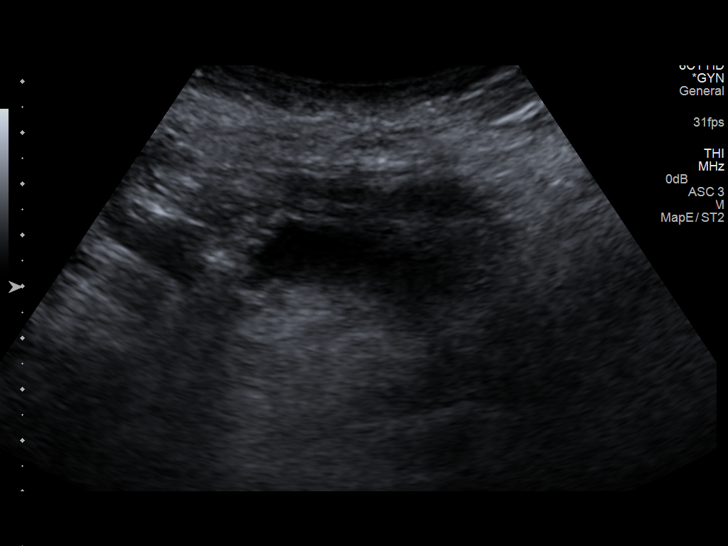
[im 7/37]
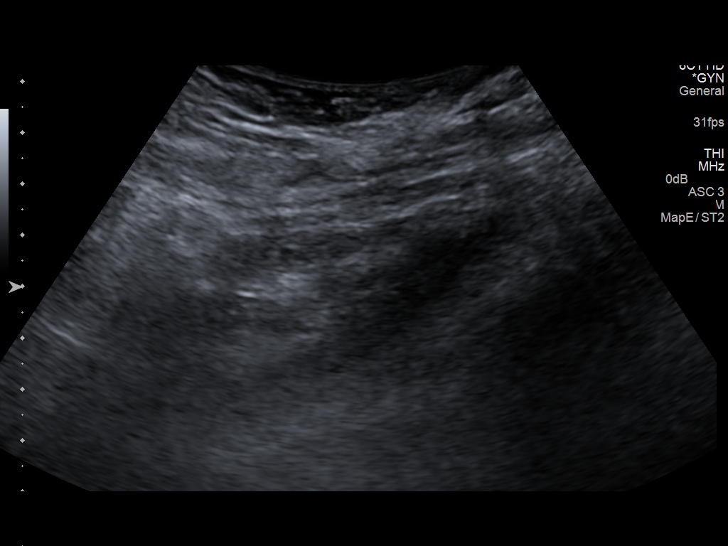
[im 10/37]
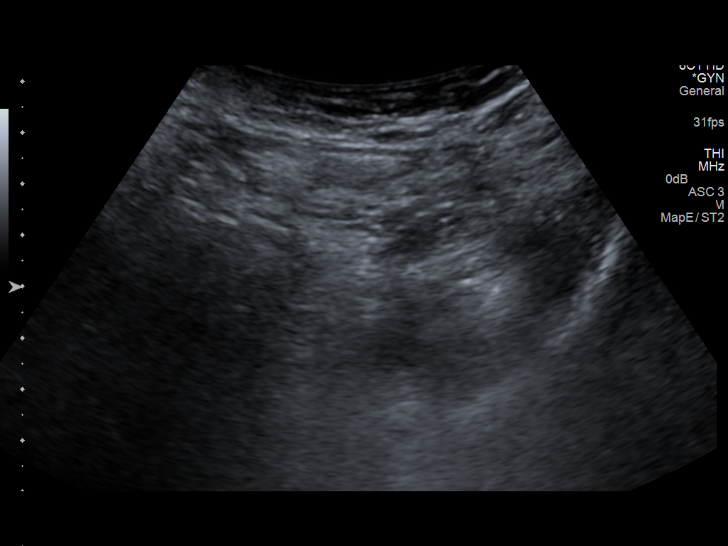
[im 13/37]
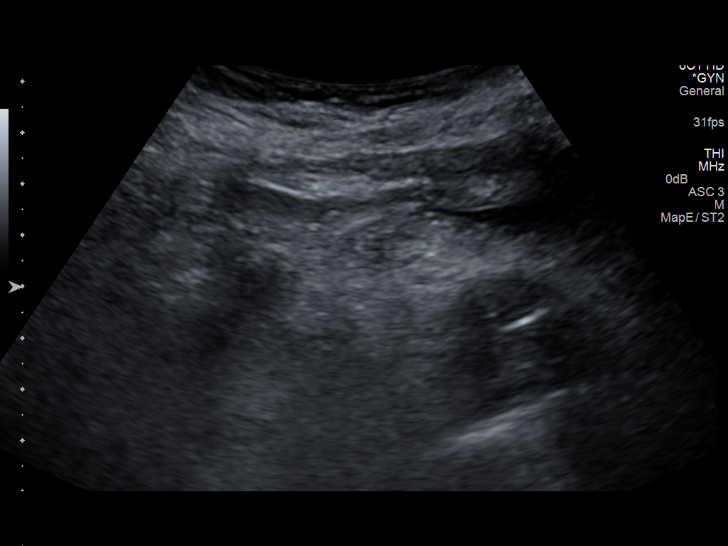
[im 14/37]
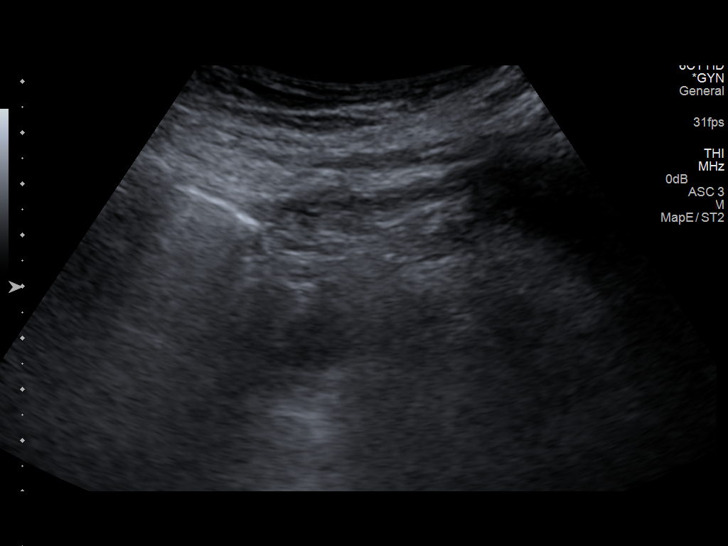
[im 17/37]
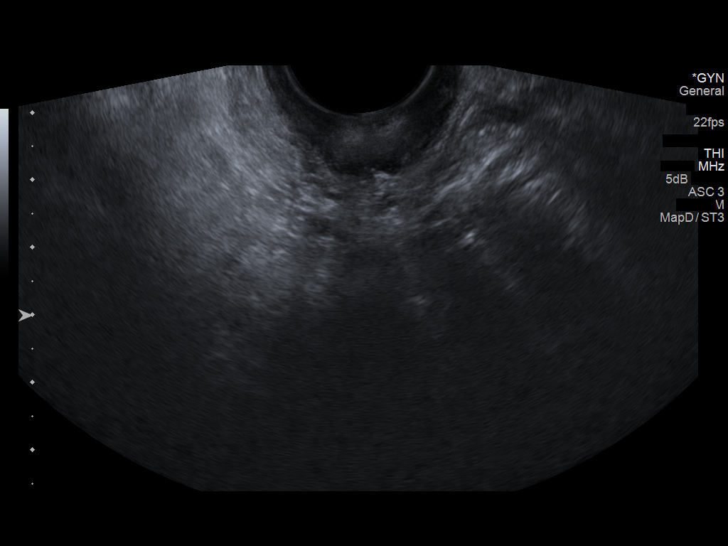
[im 20/37]
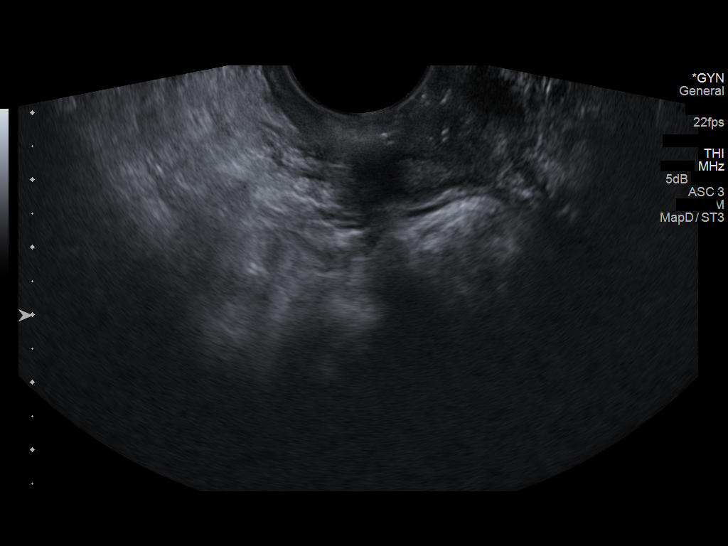
[im 23/37]
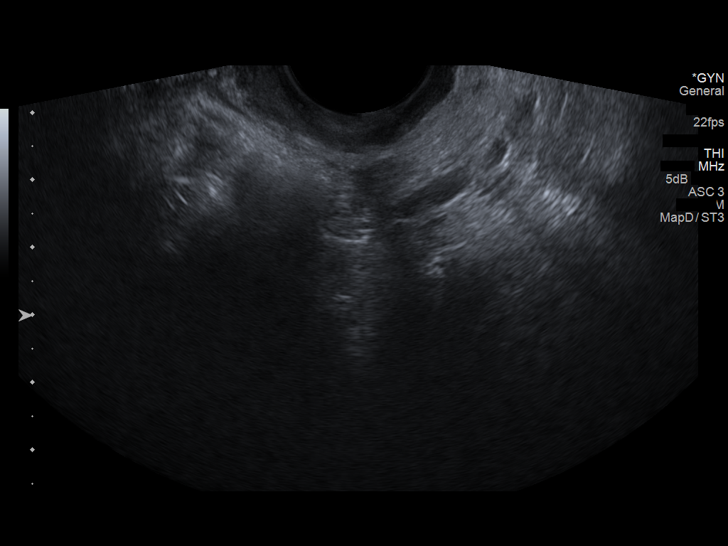
[im 25/37]
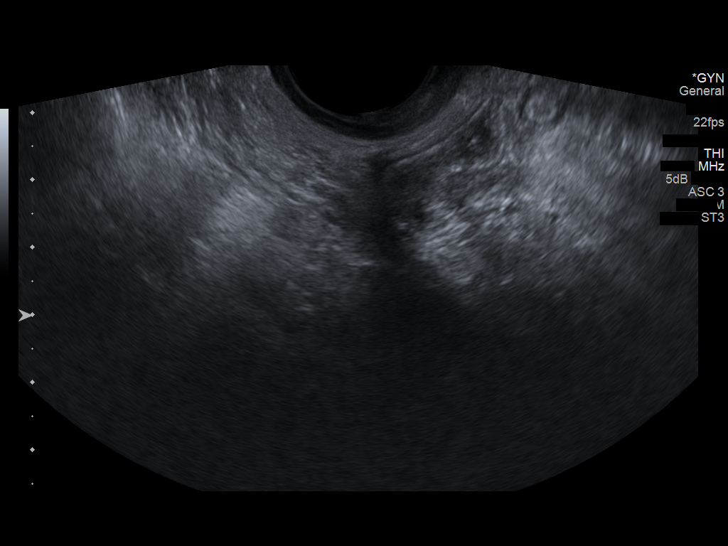
[im 28/37]
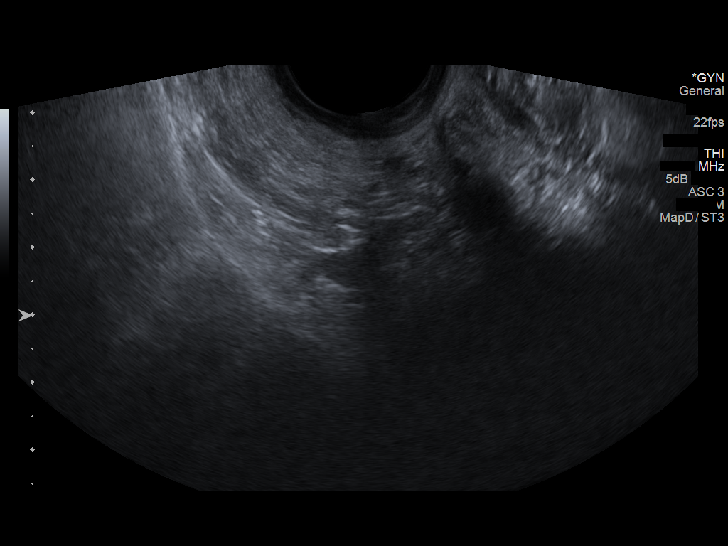
[im 31/37]
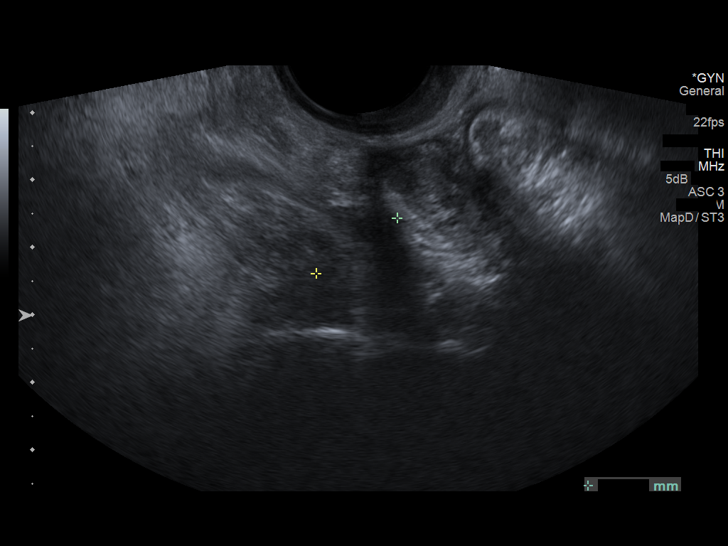
[im 34/37]
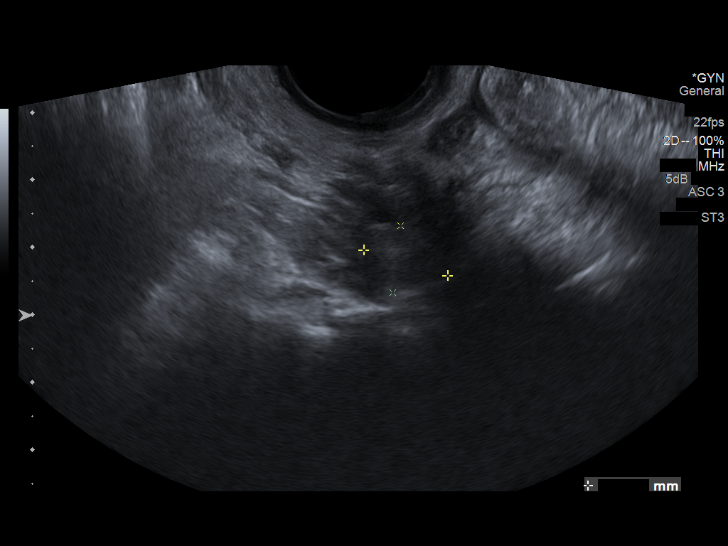
[im 37/37]
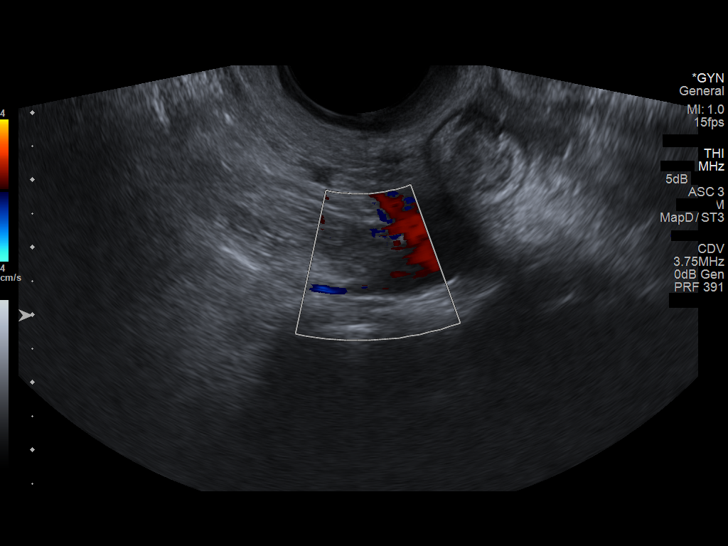

[14 of 25 positions shown; findings below may reference images not displayed]

FINDINGS: Uterus

Status post hysterectomy.

Right ovary

Measurements: 1.5 x 1.3 x 1.0 cm. Normal appearance/no adnexal mass.

Left ovary

Status post left-sided oophorectomy.

Other findings

No free fluid is seen within the pelvic cul-de-sac.
IMPRESSION: Status post hysterectomy and left-sided oophorectomy. The right
ovary is unremarkable in appearance. No evidence for ovarian
torsion.

## 2017-10-18 ENCOUNTER — Emergency Department (HOSPITAL_COMMUNITY)
Admission: EM | Admit: 2017-10-18 | Discharge: 2017-10-19 | Disposition: A | Discharge status: Home / Self Care | Payer: PRIVATE HEALTH INSURANCE | Attending: Emergency Medicine | Admitting: Emergency Medicine

## 2017-10-18 ENCOUNTER — Encounter (HOSPITAL_COMMUNITY): Payer: Self-pay | Admitting: Emergency Medicine

## 2017-10-18 ENCOUNTER — Other Ambulatory Visit: Payer: Self-pay

## 2017-10-18 ENCOUNTER — Emergency Department (HOSPITAL_COMMUNITY): Payer: PRIVATE HEALTH INSURANCE

## 2017-10-18 DIAGNOSIS — R109 Unspecified abdominal pain: Secondary | ICD-10-CM | POA: Diagnosis not present

## 2017-10-18 DIAGNOSIS — Z87891 Personal history of nicotine dependence: Secondary | ICD-10-CM | POA: Insufficient documentation

## 2017-10-18 DIAGNOSIS — R42 Dizziness and giddiness: Secondary | ICD-10-CM | POA: Diagnosis present

## 2017-10-18 DIAGNOSIS — B349 Viral infection, unspecified: Secondary | ICD-10-CM

## 2017-10-18 LAB — URINALYSIS, ROUTINE W REFLEX MICROSCOPIC
Bacteria, UA: NONE SEEN
Bilirubin Urine: NEGATIVE
Glucose, UA: NEGATIVE mg/dL
KETONES UR: NEGATIVE mg/dL
Leukocytes, UA: NEGATIVE
Nitrite: NEGATIVE
PROTEIN: NEGATIVE mg/dL
Specific Gravity, Urine: 1.016 (ref 1.005–1.030)
pH: 5 (ref 5.0–8.0)

## 2017-10-18 LAB — COMPREHENSIVE METABOLIC PANEL
ALK PHOS: 92 U/L (ref 38–126)
ALT: 13 U/L — AB (ref 14–54)
ANION GAP: 11 (ref 5–15)
AST: 19 U/L (ref 15–41)
Albumin: 3.5 g/dL (ref 3.5–5.0)
BILIRUBIN TOTAL: 0.5 mg/dL (ref 0.3–1.2)
BUN: 15 mg/dL (ref 6–20)
CALCIUM: 8.7 mg/dL — AB (ref 8.9–10.3)
CO2: 22 mmol/L (ref 22–32)
Chloride: 103 mmol/L (ref 101–111)
Creatinine, Ser: 0.97 mg/dL (ref 0.44–1.00)
GFR calc non Af Amer: >60 mL/min (ref >60)
GLUCOSE: 114 mg/dL — AB (ref 65–99)
Potassium: 3.9 mmol/L (ref 3.5–5.1)
Sodium: 136 mmol/L (ref 135–145)
TOTAL PROTEIN: 6.6 g/dL (ref 6.5–8.1)

## 2017-10-18 LAB — I-STAT BETA HCG BLOOD, ED (MC, WL, AP ONLY)

## 2017-10-18 LAB — CBC WITH DIFFERENTIAL/PLATELET
Basophils Absolute: 0 10*3/uL (ref 0.0–0.1)
Basophils Relative: 0 %
Eosinophils Absolute: 0.2 10*3/uL (ref 0.0–0.7)
Eosinophils Relative: 3 %
HEMATOCRIT: 36.8 % (ref 36.0–46.0)
HEMOGLOBIN: 12 g/dL (ref 12.0–15.0)
LYMPHS PCT: 51 %
Lymphs Abs: 2.8 10*3/uL (ref 0.7–4.0)
MCH: 29.5 pg (ref 26.0–34.0)
MCHC: 32.6 g/dL (ref 30.0–36.0)
MCV: 90.4 fL (ref 78.0–100.0)
MONOS PCT: 6 %
Monocytes Absolute: 0.3 10*3/uL (ref 0.1–1.0)
NEUTROS ABS: 2.3 10*3/uL (ref 1.7–7.7)
NEUTROS PCT: 40 %
Platelets: 219 10*3/uL (ref 150–400)
RBC: 4.07 MIL/uL (ref 3.87–5.11)
RDW: 13.5 % (ref 11.5–15.5)
WBC: 5.6 10*3/uL (ref 4.0–10.5)

## 2017-10-18 LAB — LIPASE, BLOOD: LIPASE: 30 U/L (ref 11–51)

## 2017-10-18 LAB — I-STAT TROPONIN, ED: Troponin i, poc: 0 ng/mL (ref 0.00–0.08)

## 2017-10-18 MED ORDER — MORPHINE SULFATE (PF) 4 MG/ML IV SOLN
4.0000 mg | Freq: Once | INTRAVENOUS | Status: AC
  Administered 2017-10-18: 4 mg via INTRAVENOUS
  Filled 2017-10-18: qty 1

## 2017-10-18 MED ORDER — SODIUM CHLORIDE 0.9 % IV BOLUS (SEPSIS)
1000.0000 mL | Freq: Once | INTRAVENOUS | Status: AC
  Administered 2017-10-18: 1000 mL via INTRAVENOUS

## 2017-10-18 NOTE — ED Provider Notes (Signed)
[facility] EMERGENCY DEPARTMENT Provider Note   CSN: [id number] Arrival date & time: 10/18/17  1556     History   Chief Complaint Chief Complaint  Patient presents with  . Back Pain  . Dizziness  . Shortness of Breath    HPI Susan Osborn is a 52 y.o. female history of endometriosis, previous ovarian cyst that was removed here presenting with chills, weakness, blurry vision, flank pain, cough.  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  Patient states that for the last several days she has been having left-sided flank pain, cough, chills.  Also has some blurry vision and lightheadedness but denies vomiting or diarrhea.  She states that she does not know if anybody sick around her.  The history is provided by the patient.    Past Medical History:  Diagnosis Date  . Anemia   . Cancer ([place])    ovarian  . Headache(784.0)    hx of related to anemia   . Pelvic mass in female     Patient Active Problem List   Diagnosis Date Noted  . Dyspareunia 08/24/2013  . Urinary tract infection 08/24/2013  . Intermittent lightheadedness 01/20/2013  . Postoperative anemia due to acute blood loss 01/17/2013  . Endometrioma of ovary 01/17/2013  . Ovarian cyst 12/16/2012  . Pain in joint, pelvic region and thigh 12/16/2012    Past Surgical History:  Procedure Laterality Date  . ABDOMINAL HYSTERECTOMY  1998   partial  . CESAREAN SECTION     x3  . LAPAROTOMY Left 01/13/2013   Procedure: EXPLORATORY LAPAROTOMY,BILATERAL SALPINGO OOPHORECTOMY, Left ureterolysis,Lysis of adhesions;  Surgeon: Alvino Chapel, MD;  Location: [facility];  Service: Gynecology;  Laterality: Left;    OB History    No data available       Home Medications    Prior to Admission medications   Medication Sig Start Date End Date Taking? Authorizing Provider  cephALEXin (KEFLEX) 500 MG capsule Take 1 capsule (500 mg total) by mouth 2 (two) times daily. 12/23/15    Everlene Balls, MD    Family History Family History  Problem Relation Age of Onset  . Breast cancer Mother   . Stomach cancer Mother   . Emphysema Father   . Kidney failure Brother   . Stomach cancer Paternal Grandfather   . Pancreatic cancer Maternal Aunt     Social History Social History   Tobacco Use  . Smoking status: Former Smoker    Last attempt to quit: 12/12/2010    Years since quitting: 6.8  . Smokeless tobacco: Never Used  Substance Use Topics  . Alcohol use: Yes    Comment: occas  . Drug use: No     Allergies   Patient has no known allergies.   Review of Systems Review of Systems  Constitutional: Positive for chills.  Eyes: Positive for visual disturbance.  Respiratory: Positive for cough.   Genitourinary: Positive for flank pain.  Neurological: Positive for dizziness.  All other systems reviewed and are negative.    Physical Exam Updated Vital Signs BP 117/67 (BP Location: Right Arm)   Pulse 60   Temp 98.3 F (36.8 C) (Oral)   Resp 16   Ht 5\' 2"  (1.575 m)   Wt 49.9 kg (110 lb)   SpO2 100%   BMI 20.12 kg/m   Physical Exam  Constitutional: She appears well-developed and well-nourished.  HENT:  Head: Normocephalic.  MM slightly dry   Eyes:  EOM are normal. Pupils are equal, round, and reactive to light.  Neck: Normal range of motion.  Cardiovascular: Normal rate.  Pulmonary/Chest: Effort normal and breath sounds normal.  Abdominal: Soft. Bowel sounds are normal.  + Mild L CVAT   Musculoskeletal: Normal range of motion.       Right lower leg: Normal.       Left lower leg: Normal.  Neurological: She is alert.  Skin: Skin is warm. Capillary refill takes less than 2 seconds.  Psychiatric: She has a normal mood and affect.  Nursing note and vitals reviewed.    ED Treatments / Results  Labs (all labs ordered are listed, but only abnormal results are displayed) Labs Reviewed  COMPREHENSIVE METABOLIC PANEL - Abnormal; Notable for the  following components:      Result Value   Glucose, Bld 114 (*)    Calcium 8.7 (*)    ALT 13 (*)    All other components within normal limits  URINE CULTURE  CBC WITH DIFFERENTIAL/PLATELET  LIPASE, BLOOD  URINALYSIS, ROUTINE W REFLEX MICROSCOPIC  I-STAT TROPONIN, ED  I-STAT BETA HCG BLOOD, ED ([facility], [facility], [facility] ONLY)    EKG  EKG Interpretation  Date/Time:  Friday October 18 2017 16:16:23 EST Ventricular Rate:  69 PR Interval:  162 QRS Duration: 82 QT Interval:  404 QTC Calculation: 432 R Axis:   69 Text Interpretation:  Normal sinus rhythm with sinus arrhythmia Possible Left atrial enlargement Borderline ECG No previous ECGs available Confirmed by Wandra Arthurs [phone]) on 10/18/2017 9:01:46 PM       Radiology Dg Chest 2 View  Result Date: 10/18/2017 CLINICAL DATA:  Shortness of breath and chest pain for 3 days. EXAM: CHEST - 2 VIEW COMPARISON:  06/17/2015 thoracic spine radiographs FINDINGS: The cardiomediastinal silhouette is unremarkable. There is no evidence of focal airspace disease, pulmonary edema, suspicious pulmonary nodule/mass, pleural effusion, or pneumothorax. No acute bony abnormalities are identified. IMPRESSION: No active cardiopulmonary disease. Electronically Signed   By: Margarette Canada M.D.   On: 10/18/2017 17:15   Ct Head Wo Contrast  Result Date: 10/18/2017 CLINICAL DATA:  Acute onset of headache and blurred vision. EXAM: CT HEAD WITHOUT CONTRAST TECHNIQUE: Contiguous axial images were obtained from the base of the skull through the vertex without intravenous contrast. COMPARISON:  None. FINDINGS: Brain: No evidence of acute infarction, hemorrhage, hydrocephalus, extra-axial collection or mass lesion/mass effect. The posterior fossa, including the cerebellum, brainstem and fourth ventricle, is within normal limits. The third and lateral ventricles, and basal ganglia are unremarkable in appearance. The cerebral hemispheres are symmetric in appearance, with normal gray-white  differentiation. No mass effect or midline shift is seen. Vascular: No hyperdense vessel or unexpected calcification. Skull: There is no evidence of fracture; visualized osseous structures are unremarkable in appearance. Sinuses/Orbits: The orbits are within normal limits. The paranasal sinuses and mastoid air cells are well-aerated. Other: No significant soft tissue abnormalities are seen. IMPRESSION: Unremarkable noncontrast CT of the head. Electronically Signed   By: Garald Balding M.D.   On: 10/18/2017 21:23   Ct Renal Stone Study  Result Date: 10/18/2017 CLINICAL DATA:  Subacute onset of left-sided abdominal pain and lower back pain. EXAM: CT ABDOMEN AND PELVIS WITHOUT CONTRAST TECHNIQUE: Multidetector CT imaging of the abdomen and pelvis was performed following the standard protocol without IV contrast. COMPARISON:  CT of the abdomen and pelvis from 12/23/2015 FINDINGS: Lower chest: The visualized lung bases are grossly clear. The visualized portions of the mediastinum are  unremarkable. Hepatobiliary: The liver is unremarkable in appearance. The gallbladder is unremarkable in appearance. The common bile duct remains normal in caliber. Pancreas: The pancreas is within normal limits. Spleen: The spleen is unremarkable in appearance. Adrenals/Urinary Tract: The adrenal glands are unremarkable in appearance. The kidneys are within normal limits. There is no evidence of hydronephrosis. No renal or ureteral stones are identified. No perinephric stranding is seen. Stomach/Bowel: The stomach is unremarkable in appearance. The small bowel is within normal limits. The appendix is normal in caliber, without evidence of appendicitis. The colon is unremarkable in appearance. Vascular/Lymphatic: The abdominal aorta is unremarkable in appearance. The inferior vena cava is grossly unremarkable. No retroperitoneal lymphadenopathy is seen. No pelvic sidewall lymphadenopathy is identified. Reproductive: The bladder is mildly  distended and grossly unremarkable. The patient is status post hysterectomy. No suspicious adnexal masses are seen. Other: No additional soft tissue abnormalities are seen. Musculoskeletal: No acute osseous abnormalities are identified. The visualized musculature is unremarkable in appearance. IMPRESSION: Unremarkable noncontrast CT of the abdomen and pelvis. Electronically Signed   By: Garald Balding M.D.   On: 10/18/2017 21:27    Procedures Procedures (including critical care time)  Medications Ordered in ED Medications  sodium chloride 0.9 % bolus 1,000 mL (1,000 mLs Intravenous New Bag/Given 10/18/17 2129)  morphine 4 MG/ML injection 4 mg (4 mg Intravenous Given 10/18/17 2123)     Initial Impression / Assessment and Plan / ED Course  I have reviewed the triage vital signs and the nursing notes.  Pertinent labs & imaging results that were available during my care of the patient were reviewed by me and considered in my medical decision making (see chart for details).    Susan Osborn is a 52 y.o. female here with blurry vision, dizziness, L flank pain, chills. Likely viral syndrome vs pyelo vs pneumonia. Will get labs, UA, CT renal stone, CT head, CXR.   10:04 PM WBC nl. CXR clear. CT head, renal stone neg. UA pending. Signed out to [employer] in the ED for follow up UA. Anticipate dc home pending UA results.   Final Clinical Impressions(s) / ED Diagnoses   Final diagnoses:  None    ED Discharge Orders    None       Drenda Freeze, MD 10/18/17 2206

## 2017-10-18 NOTE — ED Provider Notes (Signed)
11:19 PM Patient care assumed from Dr. Darl Householder at change of shift.  Patient presenting for a myriad of symptoms with reassuring workup in the emergency department.  Patient pending urinalysis at change of shift.  This has been reviewed and does not show evidence of a urinary tract infection.  Patient receiving IV fluids.  Will allow for completion of fluid bolus.  Otherwise, plan for continued outpatient primary care follow-up.  Suspect viral etiology of symptoms.  Return precautions discussed and provided. Patient discharged in stable condition with no unaddressed concerns.  Vitals:   10/18/17 2115 10/18/17 2130 10/18/17 2145 10/18/17 2200  BP: (!) 87/55 97/60 103/61 111/72  Pulse: (!) 57 (!) 53 (!) 52 (!) 57  Resp: '15 12 14 18  '$ Temp:      TempSrc:      SpO2: 97% 96% 98% 100%  Weight:      Height:        Results for orders placed or performed during the hospital encounter of 10/18/17  CBC with Differential/Platelet  Result Value Ref Range   WBC 5.6 4.0 - 10.5 K/uL   RBC 4.07 3.87 - 5.11 MIL/uL   Hemoglobin 12.0 12.0 - 15.0 g/dL   HCT 36.8 36.0 - 46.0 %   MCV 90.4 78.0 - 100.0 fL   MCH 29.5 26.0 - 34.0 pg   MCHC 32.6 30.0 - 36.0 g/dL   RDW 13.5 11.5 - 15.5 %   Platelets 219 150 - 400 K/uL   Neutrophils Relative % 40 %   Neutro Abs 2.3 1.7 - 7.7 K/uL   Lymphocytes Relative 51 %   Lymphs Abs 2.8 0.7 - 4.0 K/uL   Monocytes Relative 6 %   Monocytes Absolute 0.3 0.1 - 1.0 K/uL   Eosinophils Relative 3 %   Eosinophils Absolute 0.2 0.0 - 0.7 K/uL   Basophils Relative 0 %   Basophils Absolute 0.0 0.0 - 0.1 K/uL  Comprehensive metabolic panel  Result Value Ref Range   Sodium 136 135 - 145 mmol/L   Potassium 3.9 3.5 - 5.1 mmol/L   Chloride 103 101 - 111 mmol/L   CO2 22 22 - 32 mmol/L   Glucose, Bld 114 (H) 65 - 99 mg/dL   BUN 15 6 - 20 mg/dL   Creatinine, Ser 0.97 0.44 - 1.00 mg/dL   Calcium 8.7 (L) 8.9 - 10.3 mg/dL   Total Protein 6.6 6.5 - 8.1 g/dL   Albumin 3.5 3.5 - 5.0 g/dL   AST 19 15 - 41 U/L   ALT 13 (L) 14 - 54 U/L   Alkaline Phosphatase 92 38 - 126 U/L   Total Bilirubin 0.5 0.3 - 1.2 mg/dL   GFR calc non Af Amer >60 >60 mL/min   GFR calc Af Amer >60 >60 mL/min   Anion gap 11 5 - 15  Lipase, blood  Result Value Ref Range   Lipase 30 11 - 51 U/L  Urinalysis, Routine w reflex microscopic  Result Value Ref Range   Color, Urine YELLOW YELLOW   APPearance CLEAR CLEAR   Specific Gravity, Urine 1.016 1.005 - 1.030   pH 5.0 5.0 - 8.0   Glucose, UA NEGATIVE NEGATIVE mg/dL   Hgb urine dipstick SMALL (A) NEGATIVE   Bilirubin Urine NEGATIVE NEGATIVE   Ketones, ur NEGATIVE NEGATIVE mg/dL   Protein, ur NEGATIVE NEGATIVE mg/dL   Nitrite NEGATIVE NEGATIVE   Leukocytes, UA NEGATIVE NEGATIVE   RBC / HPF 0-5 0 - 5 RBC/hpf   WBC, UA  0-5 0 - 5 WBC/hpf   Bacteria, UA NONE SEEN NONE SEEN   Squamous Epithelial / LPF 0-5 (A) NONE SEEN   Mucus PRESENT   I-stat troponin, ED  Result Value Ref Range   Troponin i, poc 0.00 0.00 - 0.08 ng/mL   Comment 3          I-Stat Beta hCG blood, ED ([facility], [facility], [facility] only)  Result Value Ref Range   I-stat hCG, quantitative <5.0 <5 mIU/mL   Comment 3           Dg Chest 2 View  Result Date: 10/18/2017 CLINICAL DATA:  Shortness of breath and chest pain for 3 days. EXAM: CHEST - 2 VIEW COMPARISON:  06/17/2015 thoracic spine radiographs FINDINGS: The cardiomediastinal silhouette is unremarkable. There is no evidence of focal airspace disease, pulmonary edema, suspicious pulmonary nodule/mass, pleural effusion, or pneumothorax. No acute bony abnormalities are identified. IMPRESSION: No active cardiopulmonary disease. Electronically Signed   By: Margarette Canada M.D.   On: 10/18/2017 17:15   Ct Head Wo Contrast  Result Date: 10/18/2017 CLINICAL DATA:  Acute onset of headache and blurred vision. EXAM: CT HEAD WITHOUT CONTRAST TECHNIQUE: Contiguous axial images were obtained from the base of the skull through the vertex without intravenous contrast.  COMPARISON:  None. FINDINGS: Brain: No evidence of acute infarction, hemorrhage, hydrocephalus, extra-axial collection or mass lesion/mass effect. The posterior fossa, including the cerebellum, brainstem and fourth ventricle, is within normal limits. The third and lateral ventricles, and basal ganglia are unremarkable in appearance. The cerebral hemispheres are symmetric in appearance, with normal gray-white differentiation. No mass effect or midline shift is seen. Vascular: No hyperdense vessel or unexpected calcification. Skull: There is no evidence of fracture; visualized osseous structures are unremarkable in appearance. Sinuses/Orbits: The orbits are within normal limits. The paranasal sinuses and mastoid air cells are well-aerated. Other: No significant soft tissue abnormalities are seen. IMPRESSION: Unremarkable noncontrast CT of the head. Electronically Signed   By: Garald Balding M.D.   On: 10/18/2017 21:23   Ct Renal Stone Study  Result Date: 10/18/2017 CLINICAL DATA:  Subacute onset of left-sided abdominal pain and lower back pain. EXAM: CT ABDOMEN AND PELVIS WITHOUT CONTRAST TECHNIQUE: Multidetector CT imaging of the abdomen and pelvis was performed following the standard protocol without IV contrast. COMPARISON:  CT of the abdomen and pelvis from 12/23/2015 FINDINGS: Lower chest: The visualized lung bases are grossly clear. The visualized portions of the mediastinum are unremarkable. Hepatobiliary: The liver is unremarkable in appearance. The gallbladder is unremarkable in appearance. The common bile duct remains normal in caliber. Pancreas: The pancreas is within normal limits. Spleen: The spleen is unremarkable in appearance. Adrenals/Urinary Tract: The adrenal glands are unremarkable in appearance. The kidneys are within normal limits. There is no evidence of hydronephrosis. No renal or ureteral stones are identified. No perinephric stranding is seen. Stomach/Bowel: The stomach is unremarkable in  appearance. The small bowel is within normal limits. The appendix is normal in caliber, without evidence of appendicitis. The colon is unremarkable in appearance. Vascular/Lymphatic: The abdominal aorta is unremarkable in appearance. The inferior vena cava is grossly unremarkable. No retroperitoneal lymphadenopathy is seen. No pelvic sidewall lymphadenopathy is identified. Reproductive: The bladder is mildly distended and grossly unremarkable. The patient is status post hysterectomy. No suspicious adnexal masses are seen. Other: No additional soft tissue abnormalities are seen. Musculoskeletal: No acute osseous abnormalities are identified. The visualized musculature is unremarkable in appearance. IMPRESSION: Unremarkable noncontrast CT of the abdomen and  pelvis. Electronically Signed   By: Garald Balding M.D.   On: 10/18/2017 21:27      Antonietta Breach, PA-C 10/18/17 2320    Valarie Merino, MD 10/19/17 Adelfa Koh

## 2017-10-18 NOTE — ED Notes (Signed)
ED Provider at bedside. 

## 2017-10-18 NOTE — Discharge Instructions (Addendum)
Your workup in the emergency department was reassuring and did not reveal a concerning cause of your symptoms.  We recommend the use of Tylenol or ibuprofen for any complaints of discomfort or pain.  Use as prescribed on the bottle.  Get plenty of rest and drink plenty of clear liquids.  We recommend close follow-up with your primary care provider regarding your visit to the emergency department today.

## 2017-10-18 NOTE — ED Notes (Signed)
Called for pt x 3 no answer 

## 2017-10-18 NOTE — ED Triage Notes (Signed)
Patient presents with pain to her left side and lower back, shob, lightheadedness, blurry vision for three days.

## 2017-10-20 LAB — URINE CULTURE: CULTURE: NO GROWTH

## 2018-10-24 ENCOUNTER — Emergency Department (HOSPITAL_COMMUNITY)
Admission: EM | Admit: 2018-10-24 | Discharge: 2018-10-24 | Disposition: A | Discharge status: Home / Self Care | Payer: PRIVATE HEALTH INSURANCE | Attending: Emergency Medicine | Admitting: Emergency Medicine

## 2018-10-24 ENCOUNTER — Other Ambulatory Visit: Payer: Self-pay

## 2018-10-24 ENCOUNTER — Encounter (HOSPITAL_COMMUNITY): Payer: Self-pay | Admitting: *Deleted

## 2018-10-24 DIAGNOSIS — Z87891 Personal history of nicotine dependence: Secondary | ICD-10-CM | POA: Insufficient documentation

## 2018-10-24 DIAGNOSIS — Z8543 Personal history of malignant neoplasm of ovary: Secondary | ICD-10-CM | POA: Insufficient documentation

## 2018-10-24 DIAGNOSIS — M25511 Pain in right shoulder: Secondary | ICD-10-CM | POA: Insufficient documentation

## 2018-10-24 DIAGNOSIS — M79601 Pain in right arm: Secondary | ICD-10-CM | POA: Insufficient documentation

## 2018-10-24 MED ORDER — IBUPROFEN 800 MG PO TABS: 800.0000 mg | ORAL_TABLET | Freq: Three times a day (TID) | ORAL | 0 refills | Status: AC

## 2018-10-24 MED ORDER — IBUPROFEN 800 MG PO TABS
800.0000 mg | ORAL_TABLET | Freq: Once | ORAL | Status: AC
  Administered 2018-10-24: 800 mg via ORAL
  Filled 2018-10-24: qty 1

## 2018-10-24 NOTE — ED Notes (Signed)
Bed: WA19 Expected date:  Expected time:  Means of arrival:  Comments: Triage 4 

## 2018-10-24 NOTE — ED Triage Notes (Signed)
Per [facility], pt c/o right arm pain that started x 4 days ago.  Pt denies trauma/injury.  Describes as sharp/cooling pain that radiates into right shoulder.

## 2018-10-24 NOTE — ED Provider Notes (Signed)
[facility] DEPT Provider Note   CSN: [id number] Arrival date & time: 10/24/18  0133    History   Chief Complaint Chief Complaint  Patient presents with  . Arm Pain    right    HPI Susan Osborn is a 53 y.o. female.     Patient presents to the emergency department with a chief complaint of right arm pain.  She states pain started about 4 days ago.  She denies any trauma or injury.  She states that the pain radiates from her shoulder down her arm.  Symptoms are worsened with overhead movements.  No treatments prior to arrival.  Denies any numbness or weakness.  The history is provided by the patient. No language interpreter was used.    Past Medical History:  Diagnosis Date  . Anemia   . Cancer ([place])    ovarian  . Headache(784.0)    hx of related to anemia   . Pelvic mass in female     Patient Active Problem List   Diagnosis Date Noted  . Dyspareunia 08/24/2013  . Urinary tract infection 08/24/2013  . Intermittent lightheadedness 01/20/2013  . Postoperative anemia due to acute blood loss 01/17/2013  . Endometrioma of ovary 01/17/2013  . Ovarian cyst 12/16/2012  . Pain in joint, pelvic region and thigh 12/16/2012    Past Surgical History:  Procedure Laterality Date  . ABDOMINAL HYSTERECTOMY  1998   partial  . CESAREAN SECTION     x3  . LAPAROTOMY Left 01/13/2013   Procedure: EXPLORATORY LAPAROTOMY,BILATERAL SALPINGO OOPHORECTOMY, Left ureterolysis,Lysis of adhesions;  Surgeon: Alvino Chapel, MD;  Location: [facility];  Service: Gynecology;  Laterality: Left;     OB History   No obstetric history on file.      Home Medications    Prior to Admission medications   Medication Sig Start Date End Date Taking? Authorizing Provider  acetaminophen (TYLENOL) 325 MG tablet Take 650 mg by mouth every 6 (six) hours as needed (for headaches).    Yes [provider]  cephALEXin (KEFLEX) 500 MG capsule Take 1 capsule  (500 mg total) by mouth 2 (two) times daily. Patient not taking: Reported on 10/18/2017 12/23/15   Everlene Balls, MD  ibuprofen (ADVIL,MOTRIN) 800 MG tablet Take 1 tablet (800 mg total) by mouth 3 (three) times daily. 10/24/18   Montine Circle, PA-C    Family History Family History  Problem Relation Age of Onset  . Breast cancer Mother   . Stomach cancer Mother   . Emphysema Father   . Kidney failure Brother   . Stomach cancer Paternal Grandfather   . Pancreatic cancer Maternal Aunt     Social History Social History   Tobacco Use  . Smoking status: Former Smoker    Last attempt to quit: 12/12/2010    Years since quitting: 7.8  . Smokeless tobacco: Never Used  Substance Use Topics  . Alcohol use: Yes    Comment: occas  . Drug use: No     Allergies   Patient has no known allergies.   Review of Systems Review of Systems  All other systems reviewed and are negative.    Physical Exam Updated Vital Signs BP (!) 144/84   Pulse 64   Temp 98.2 F (36.8 C)   Resp 15   Ht 5\' 2"  (1.575 m)   Wt 57.2 kg   SpO2 98%   BMI 23.05 kg/m   Physical Exam Nursing note and vitals reviewed.  Constitutional: Pt appears well-developed and well-nourished. No distress.  HENT:  Head: Normocephalic and atraumatic.  Eyes: Conjunctivae are normal.  Neck: Normal range of motion.  Cardiovascular: Normal rate, regular rhythm. Intact distal pulses.   Capillary refill < 3 sec.  Pulmonary/Chest: Effort normal and breath sounds normal.  Musculoskeletal:  RUE Pt exhibits no bony abnormality or deformity, there is a positive Hawkins-Kennedy test.   ROM: 5/5 but painful with overhead movements  Strength: 5/5 Neurological: Pt  is alert. Coordination normal.  Sensation:5/5 Skin: Skin is warm and dry. Pt is not diaphoretic.  No evidence of open wound or skin tenting Psychiatric: Pt has a normal mood and affect.     ED Treatments / Results  Labs (all labs ordered are listed, but only  abnormal results are displayed) Labs Reviewed - No data to display  EKG None  Radiology No results found.  Procedures Procedures (including critical care time)  Medications Ordered in ED Medications  ibuprofen (ADVIL,MOTRIN) tablet 800 mg (has no administration in time range)     Initial Impression / Assessment and Plan / ED Course  I have reviewed the triage vital signs and the nursing notes.  Pertinent labs & imaging results that were available during my care of the patient were reviewed by me and considered in my medical decision making (see chart for details).       Patient with right shoulder and arm pain.  The pain radiates from her shoulder down her arm.  No traumatic injuries.  No skin lesions.  Good pulses.  Sensation intact.  Physical exam is consistent with cervical radiculopathy versus shoulder impingement syndrome.  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  We will give the patient NSAIDs and exercises.  Final Clinical Impressions(s) / ED Diagnoses   Final diagnoses:  Right arm pain  Acute pain of right shoulder    ED Discharge Orders         Ordered    ibuprofen (ADVIL,MOTRIN) 800 MG tablet  3 times daily     10/24/18 0306           Montine Circle, PA-C 10/24/18 0309    Ward, Delice Bison, DO 10/24/18 [phone]

## 2019-02-01 IMAGING — CR DG CHEST 2V
2 series · 2 of 2 positions shown · non-contrast
Comparison: 06/17/2015 thoracic spine radiographs

CLINICAL DATA: Shortness of breath and chest pain for 3 days.

EXAM:
CHEST - 2 VIEW

[chest pa]
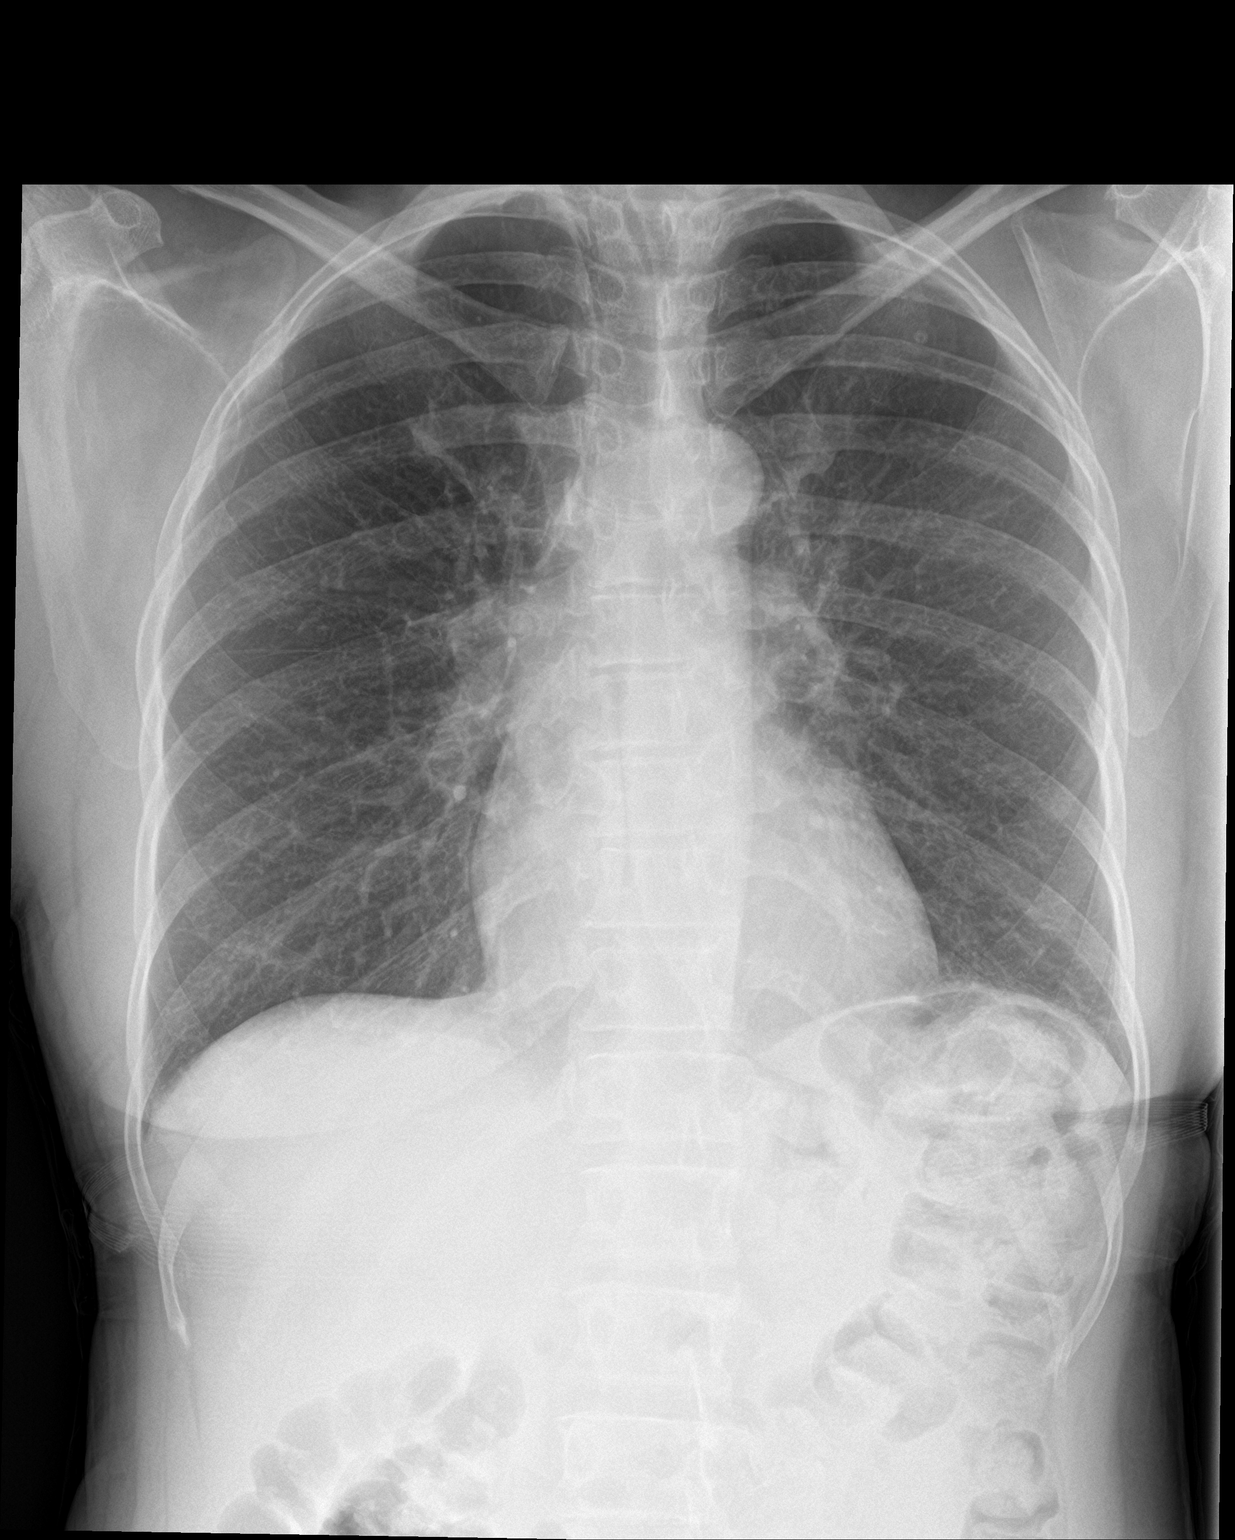

[chest lat]
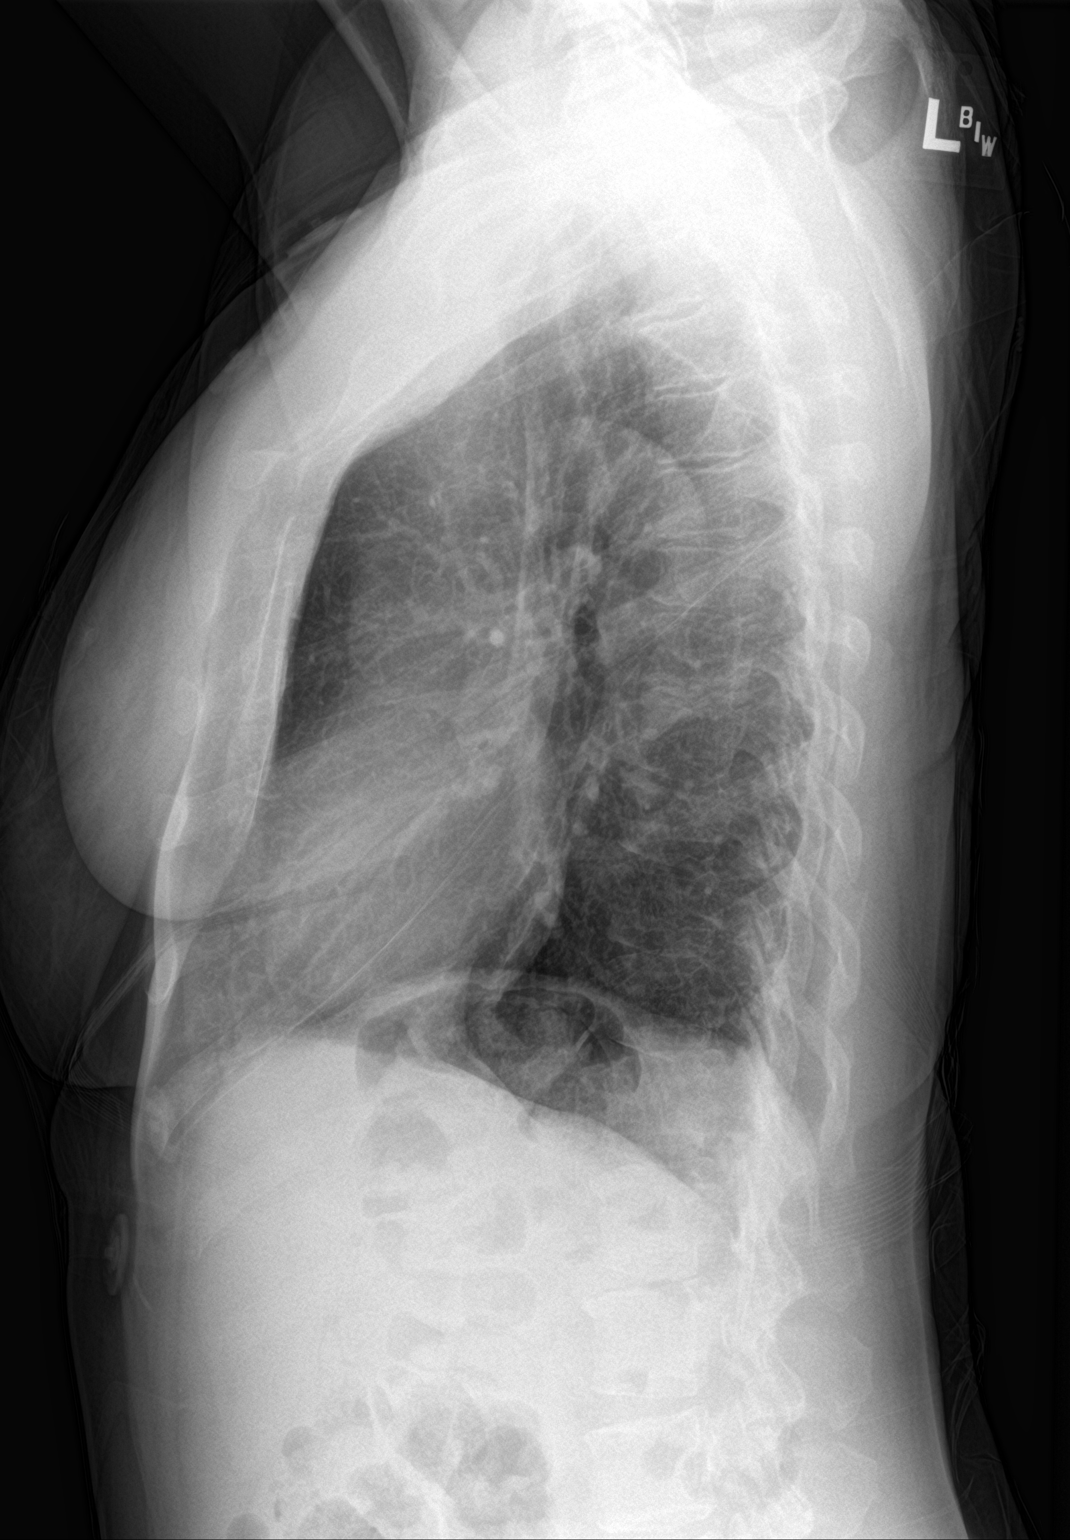

[2 of 2 positions shown; findings below may reference images not displayed]

FINDINGS: The cardiomediastinal silhouette is unremarkable.

There is no evidence of focal airspace disease, pulmonary edema,
suspicious pulmonary nodule/mass, pleural effusion, or pneumothorax.

No acute bony abnormalities are identified.
IMPRESSION: No active cardiopulmonary disease.

## 2019-02-01 IMAGING — CT CT RENAL STONE PROTOCOL
2 of 4 series · 16 of 46 positions shown, 18 images · non-contrast
Comparison: CT of the abdomen and pelvis from 12/23/2015

CLINICAL DATA: Subacute onset of left-sided abdominal pain and
lower back pain.

EXAM:
CT ABDOMEN AND PELVIS WITHOUT CONTRAST
TECHNIQUE: Multidetector CT imaging of the abdomen and pelvis was performed
following the standard protocol without IV contrast.

[Series 3: ap without · axial · non-contrast · 0.63mm/px · z∈[-875,-490]mm · 13 of 87 slices shown, 15 images]
[im 5/87  soft-tissue]
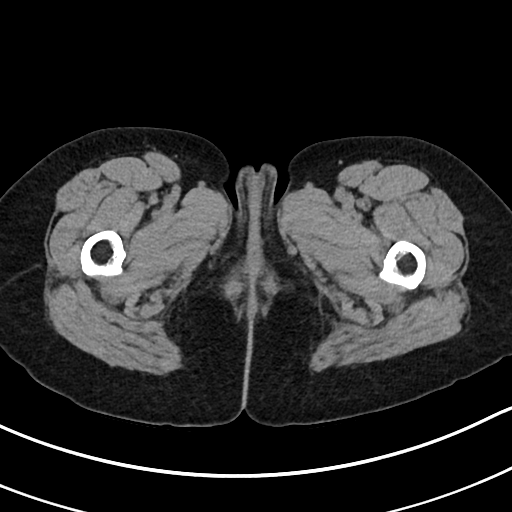
[im 5/87  bone]
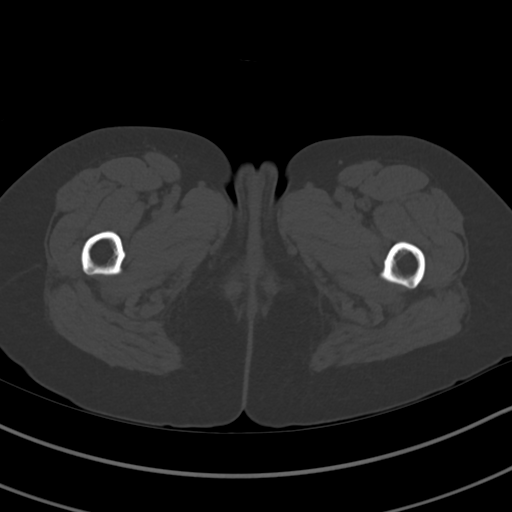
[im 13/87  soft-tissue]
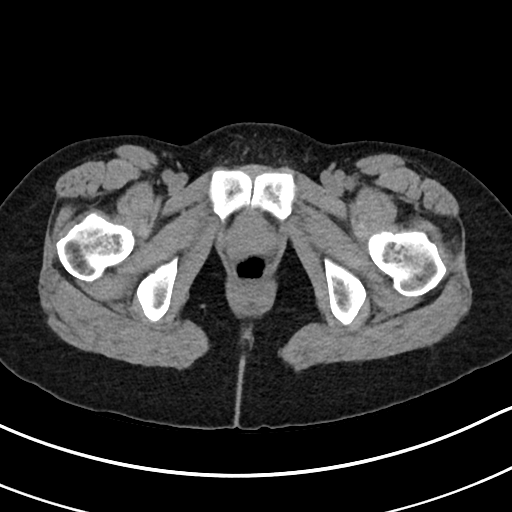
[im 18/87  soft-tissue]
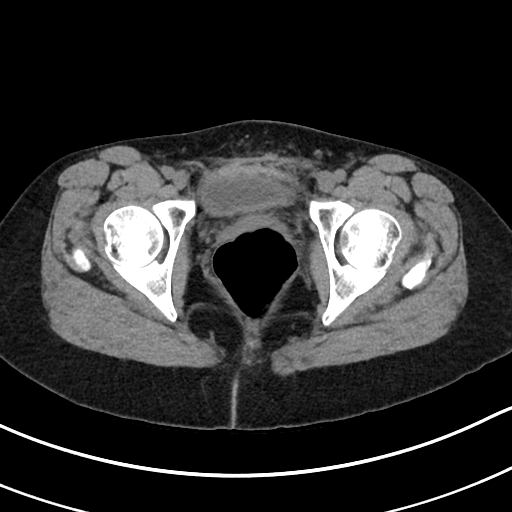
[im 26/87  soft-tissue]
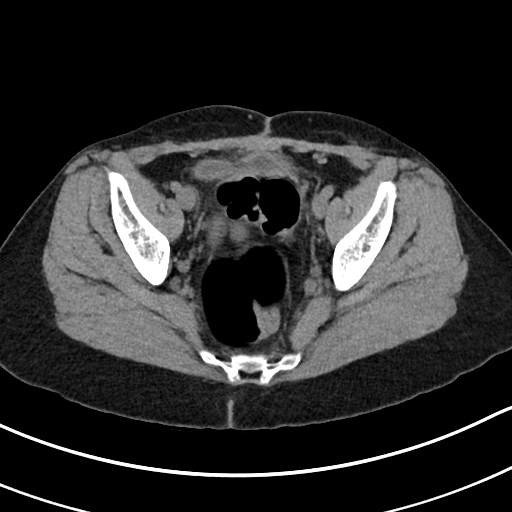
[im 31/87  soft-tissue]
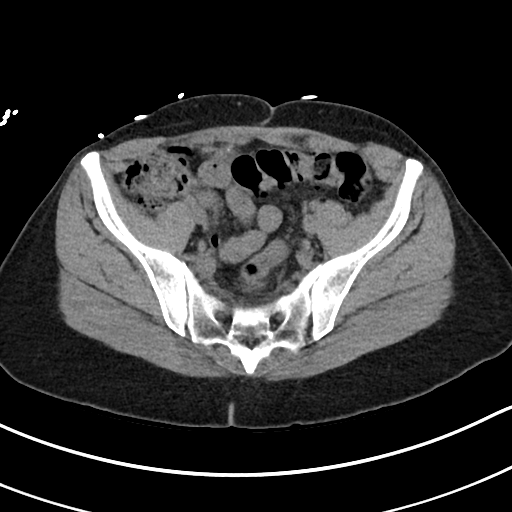
[im 39/87  soft-tissue]
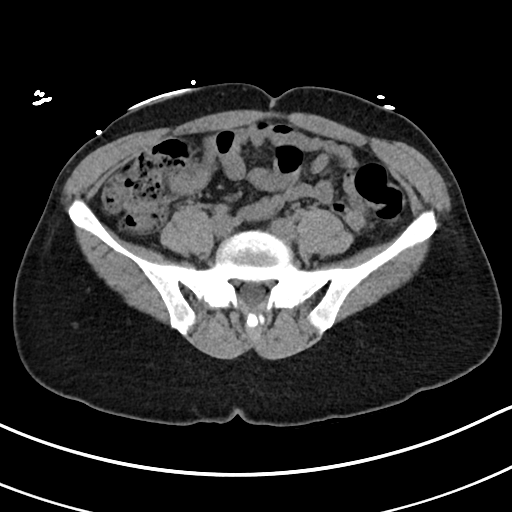
[im 44/87  soft-tissue]
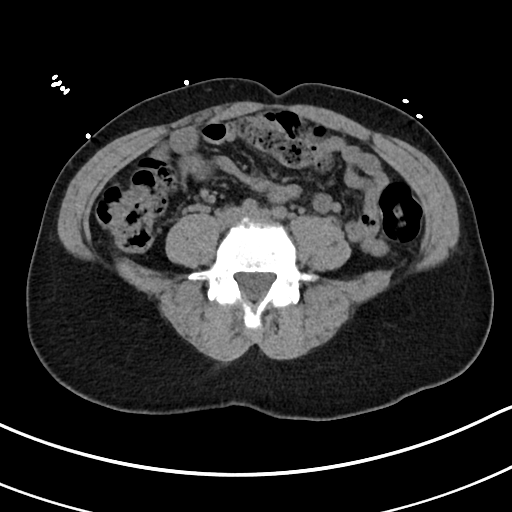
[im 48/87  soft-tissue]
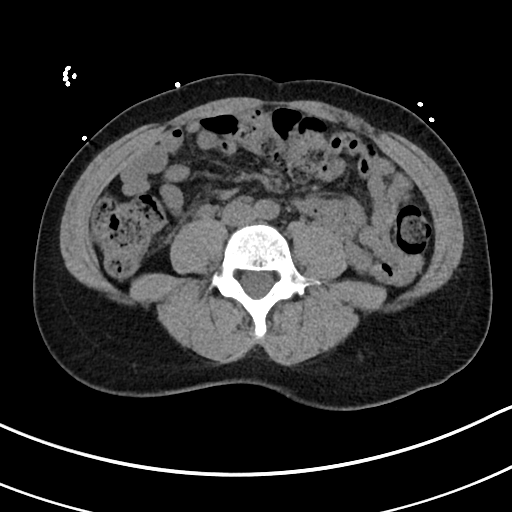
[im 56/87  soft-tissue]
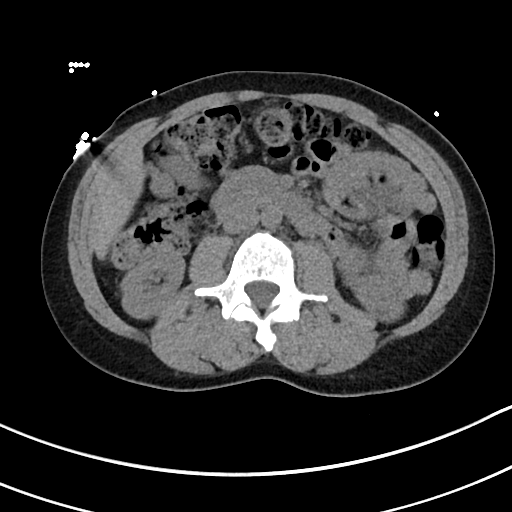
[im 56/87  bone]
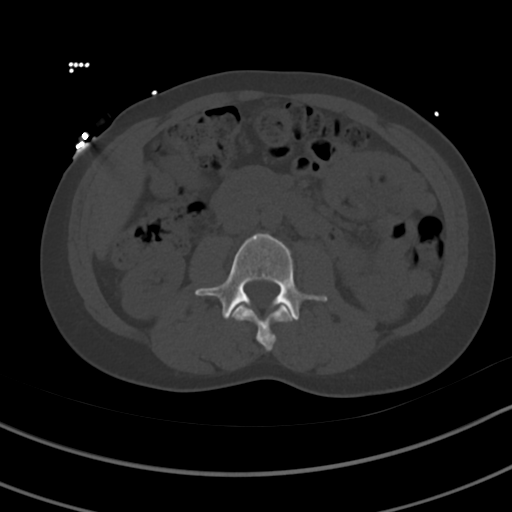
[im 61/87  soft-tissue]
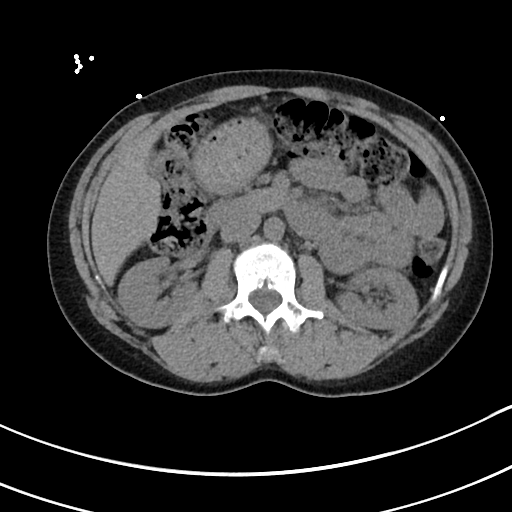
[im 69/87  soft-tissue]
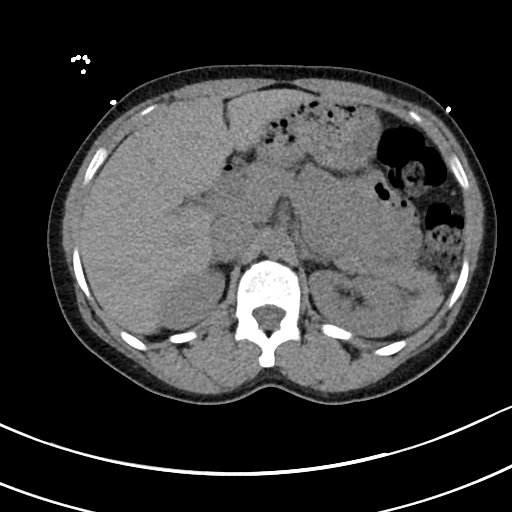
[im 74/87  soft-tissue]
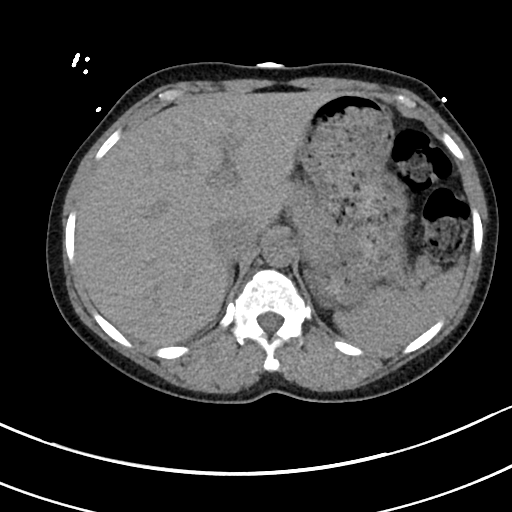
[im 82/87  soft-tissue]
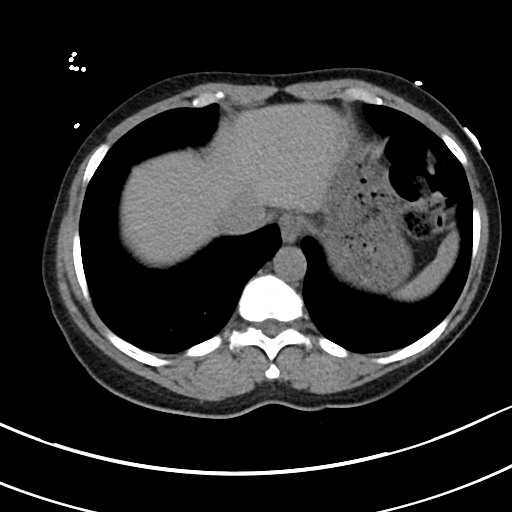

[Series 6: cor · coronal · 0.66mm/px · 3 of 80 slices shown]
[im 27/80  soft-tissue]
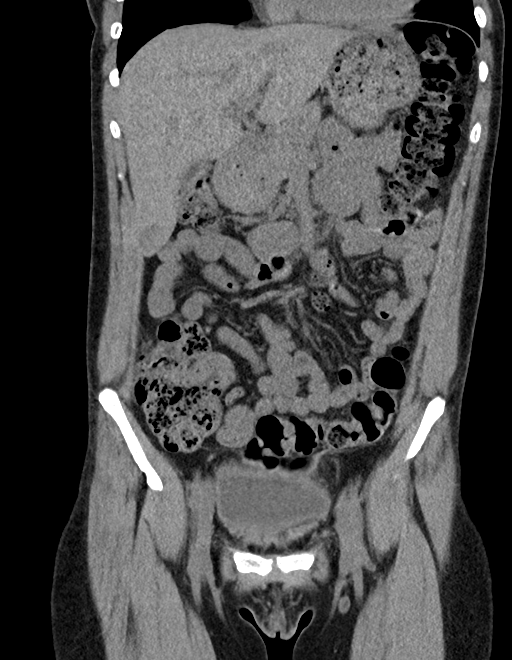
[im 36/80  soft-tissue]
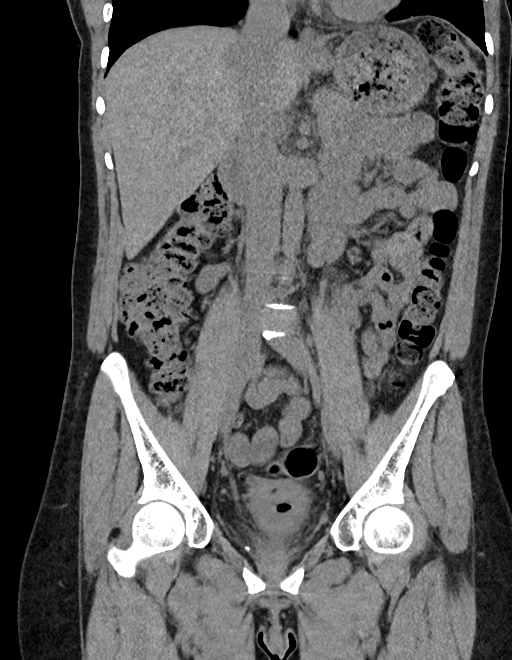
[im 44/80  soft-tissue]
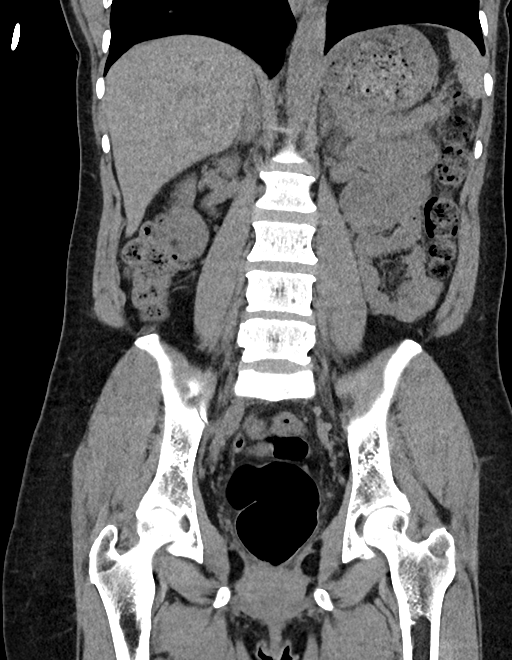

[16 of 46 positions shown; findings below may reference images not displayed]

FINDINGS: Lower chest: The visualized lung bases are grossly clear. The
visualized portions of the mediastinum are unremarkable.

Hepatobiliary: The liver is unremarkable in appearance. The
gallbladder is unremarkable in appearance. The common bile duct
remains normal in caliber.

Pancreas: The pancreas is within normal limits.

Spleen: The spleen is unremarkable in appearance.

Adrenals/Urinary Tract: The adrenal glands are unremarkable in
appearance. The kidneys are within normal limits. There is no
evidence of hydronephrosis. No renal or ureteral stones are
identified. No perinephric stranding is seen.

Stomach/Bowel: The stomach is unremarkable in appearance. The small
bowel is within normal limits. The appendix is normal in caliber,
without evidence of appendicitis. The colon is unremarkable in
appearance.

Vascular/Lymphatic: The abdominal aorta is unremarkable in
appearance. The inferior vena cava is grossly unremarkable. No
retroperitoneal lymphadenopathy is seen. No pelvic sidewall
lymphadenopathy is identified.

Reproductive: The bladder is mildly distended and grossly
unremarkable. The patient is status post hysterectomy. No suspicious
adnexal masses are seen.

Other: No additional soft tissue abnormalities are seen.

Musculoskeletal: No acute osseous abnormalities are identified. The
visualized musculature is unremarkable in appearance.
IMPRESSION: Unremarkable noncontrast CT of the abdomen and pelvis.

## 2019-02-01 IMAGING — CT CT HEAD W/O CM
4 series · 16 of 47 positions shown, 18 images · non-contrast
Comparison: None.

CLINICAL DATA: Acute onset of headache and blurred vision.

EXAM:
CT HEAD WITHOUT CONTRAST
TECHNIQUE: Contiguous axial images were obtained from the base of the skull
through the vertex without intravenous contrast.

[Series 3: head wo · axial · 0.42mm/px · z∈[-194,-74]mm · 7 of 34 slices shown, 9 images]
[im 5/34  brain]
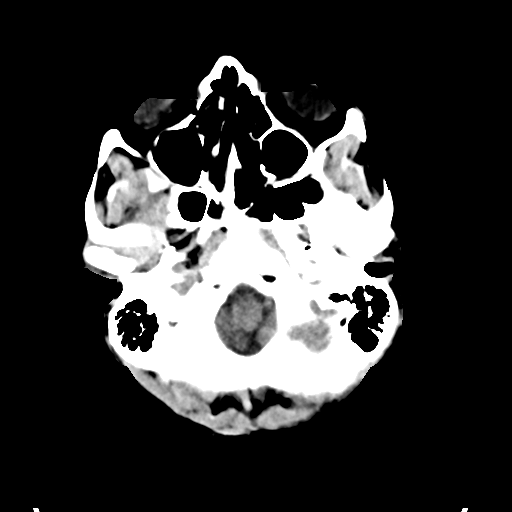
[im 5/34  bone]
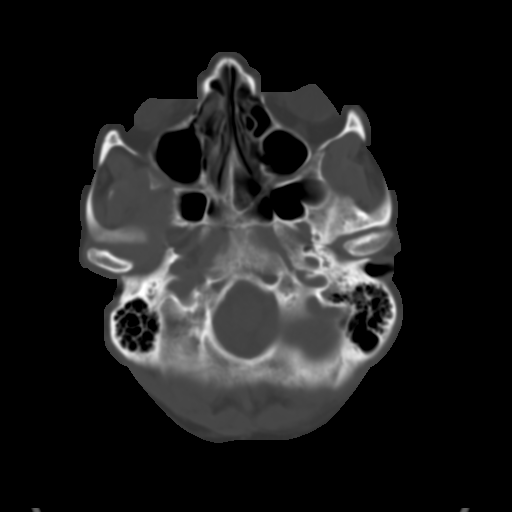
[im 9/34  brain]
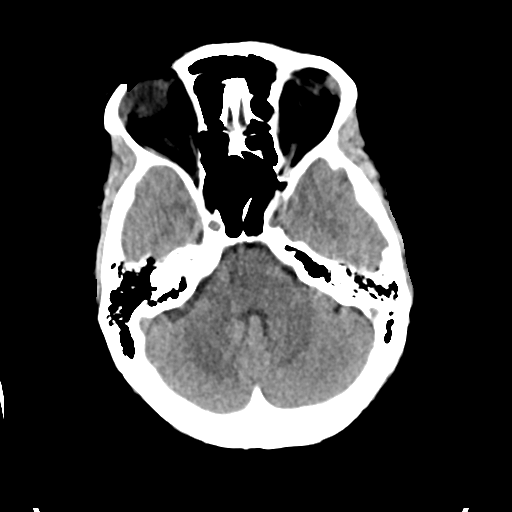
[im 13/34  brain]
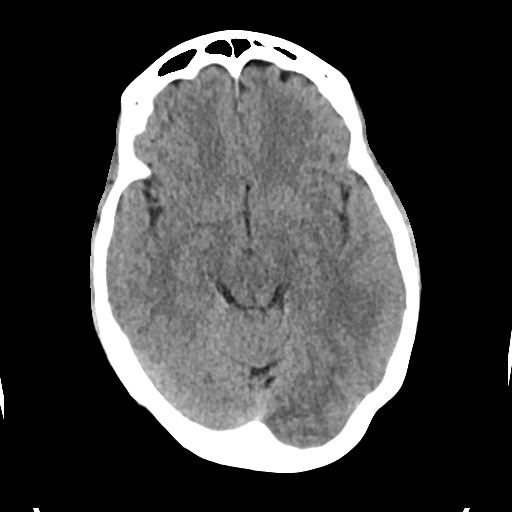
[im 17/34  brain]
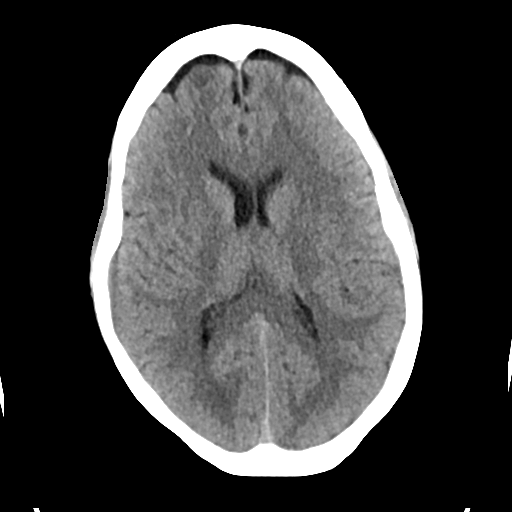
[im 21/34  brain]
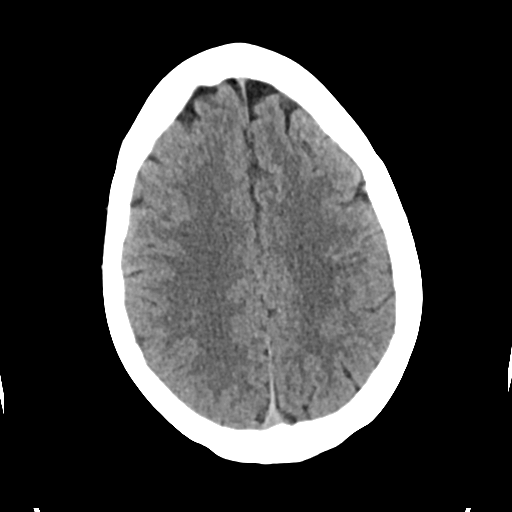
[im 21/34  bone]
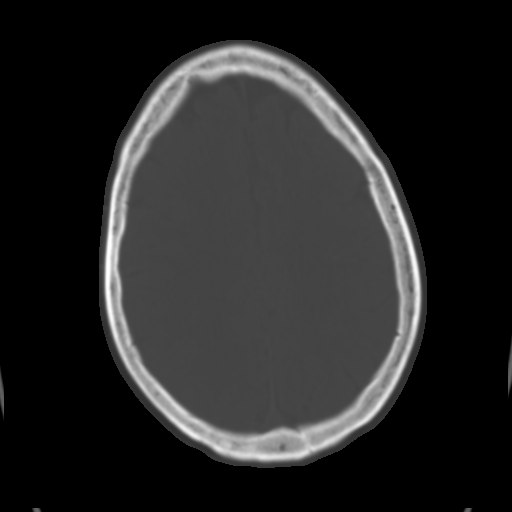
[im 25/34  brain]
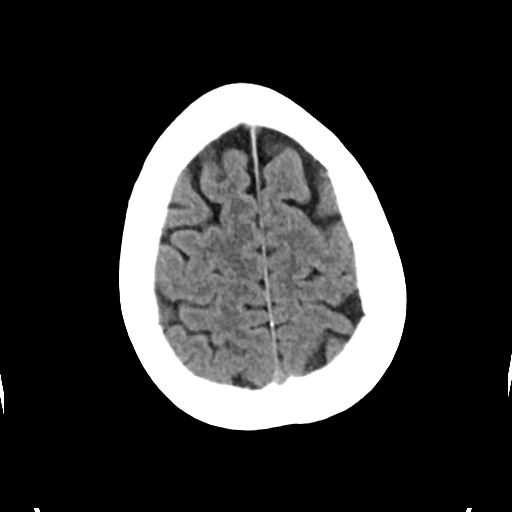
[im 29/34  brain]
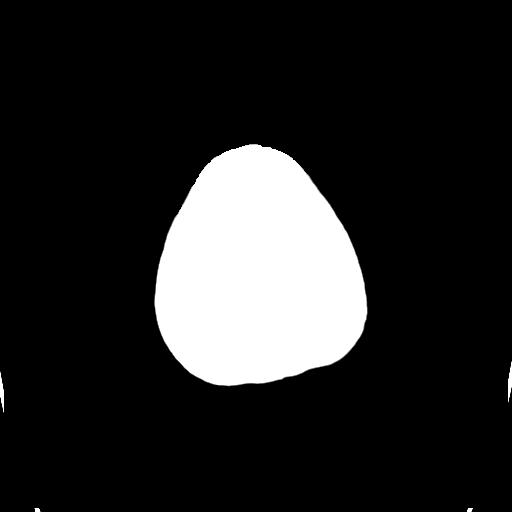

[Series 4: head bone · axial · 0.42mm/px · z∈[-198,-166]mm · 3 of 83 slices shown]
[im 9/83  bone]
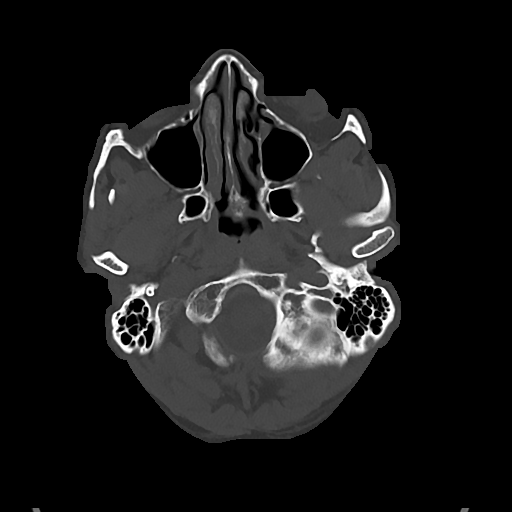
[im 17/83  bone]
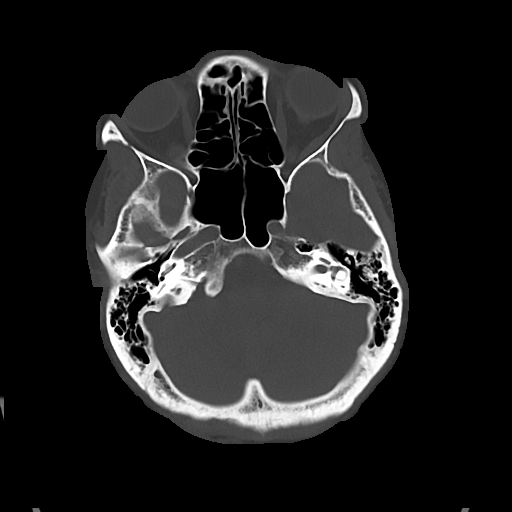
[im 25/83  bone]
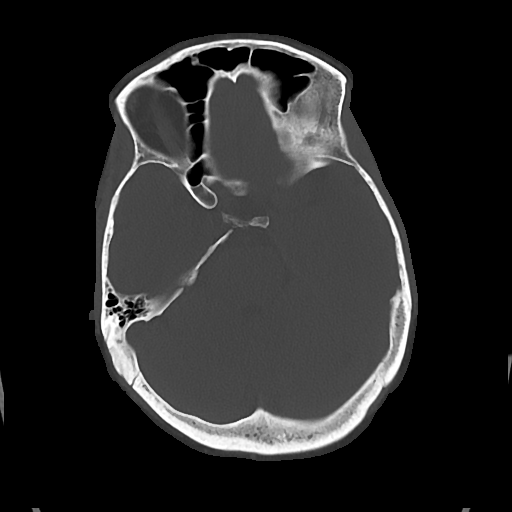

[Series 5: cor soft · coronal · 0.32mm/px · 3 of 67 slices shown]
[im 23/67  brain]
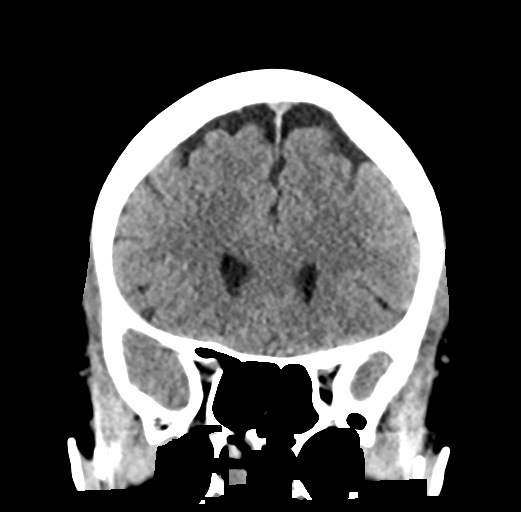
[im 30/67  brain]
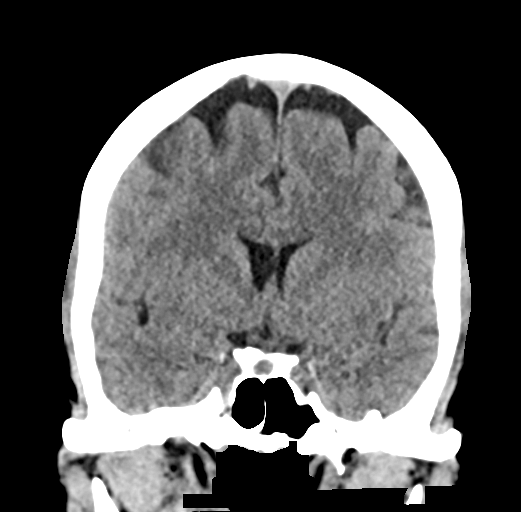
[im 37/67  brain]
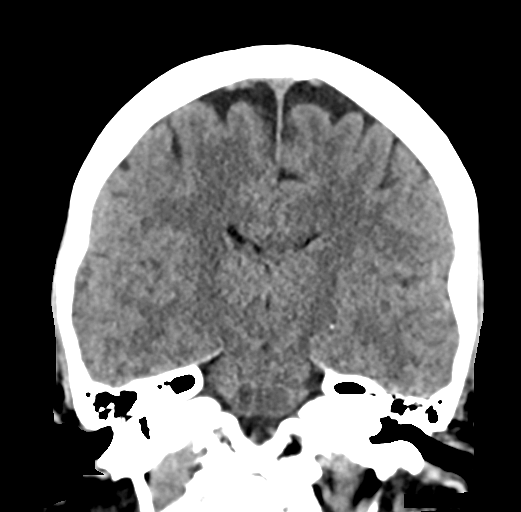

[Series 6: sag soft · sagittal · 0.32mm/px · 3 of 54 slices shown]
[im 18/54  brain]
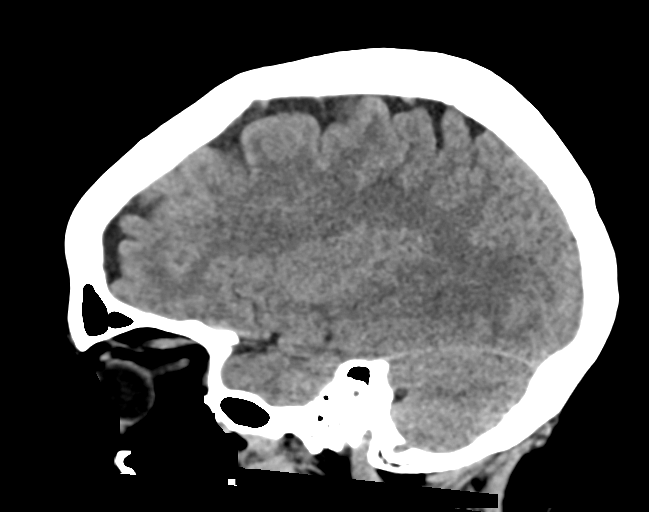
[im 27/54  brain]
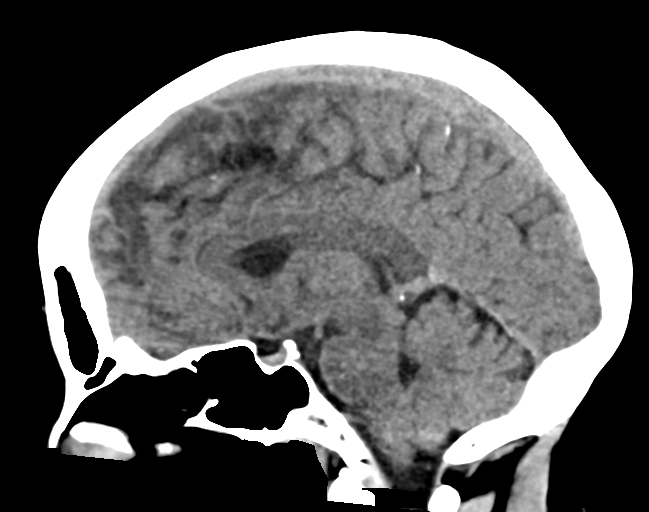
[im 36/54  brain]
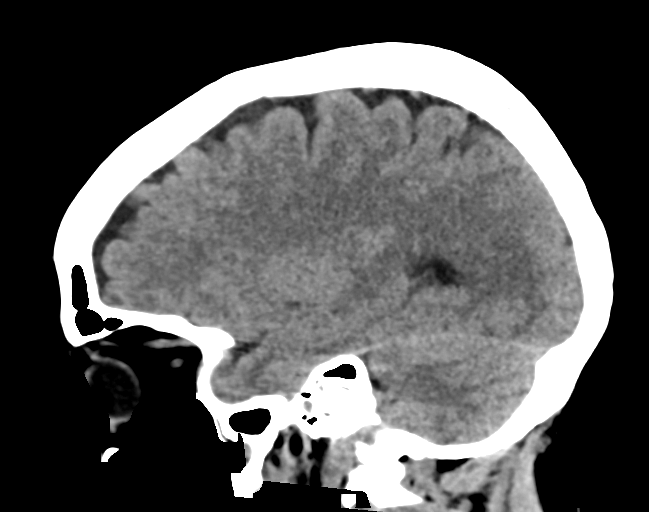

[16 of 47 positions shown; findings below may reference images not displayed]

FINDINGS: Brain: No evidence of acute infarction, hemorrhage, hydrocephalus,
extra-axial collection or mass lesion/mass effect.

The posterior fossa, including the cerebellum, brainstem and fourth
ventricle, is within normal limits. The third and lateral
ventricles, and basal ganglia are unremarkable in appearance. The
cerebral hemispheres are symmetric in appearance, with normal
gray-white differentiation. No mass effect or midline shift is seen.

Vascular: No hyperdense vessel or unexpected calcification.

Skull: There is no evidence of fracture; visualized osseous
structures are unremarkable in appearance.

Sinuses/Orbits: The orbits are within normal limits. The paranasal
sinuses and mastoid air cells are well-aerated.

Other: No significant soft tissue abnormalities are seen.
IMPRESSION: Unremarkable noncontrast CT of the head.

## 2023-02-13 ENCOUNTER — Ambulatory Visit: Payer: Medicaid Other | Admitting: Physician Assistant

## 2024-03-06 ENCOUNTER — Emergency Department (HOSPITAL_BASED_OUTPATIENT_CLINIC_OR_DEPARTMENT_OTHER)
Admission: EM | Admit: 2024-03-06 | Discharge: 2024-03-06 | Disposition: A | Discharge status: Home / Self Care | Payer: Self-pay | Attending: Emergency Medicine | Admitting: Emergency Medicine

## 2024-03-06 ENCOUNTER — Encounter (HOSPITAL_BASED_OUTPATIENT_CLINIC_OR_DEPARTMENT_OTHER): Payer: Self-pay | Admitting: Emergency Medicine

## 2024-03-06 DIAGNOSIS — L0390 Cellulitis, unspecified: Secondary | ICD-10-CM

## 2024-03-06 DIAGNOSIS — L03313 Cellulitis of chest wall: Secondary | ICD-10-CM | POA: Insufficient documentation

## 2024-03-06 LAB — CBC
HCT: 37.5 % (ref 36.0–46.0)
Hemoglobin: 12.6 g/dL (ref 12.0–15.0)
MCH: 32.1 pg (ref 26.0–34.0)
MCHC: 33.6 g/dL (ref 30.0–36.0)
MCV: 95.4 fL (ref 80.0–100.0)
Platelets: 221 K/uL (ref 150–400)
RBC: 3.93 MIL/uL (ref 3.87–5.11)
RDW: 15.1 % (ref 11.5–15.5)
WBC: 4.9 K/uL (ref 4.0–10.5)
nRBC: 0 % (ref 0.0–0.2)

## 2024-03-06 LAB — BASIC METABOLIC PANEL WITH GFR
Anion gap: 11 (ref 5–15)
BUN: 20 mg/dL (ref 6–20)
CO2: 25 mmol/L (ref 22–32)
Calcium: 9.5 mg/dL (ref 8.9–10.3)
Chloride: 103 mmol/L (ref 98–111)
Creatinine, Ser: 0.89 mg/dL (ref 0.44–1.00)
GFR, Estimated: >60 mL/min (ref >60)
Glucose, Bld: 103 mg/dL — ABNORMAL HIGH (ref 70–99)
Potassium: 4.1 mmol/L (ref 3.5–5.1)
Sodium: 139 mmol/L (ref 135–145)

## 2024-03-06 MED ORDER — DOXYCYCLINE HYCLATE 100 MG PO TABS
100.0000 mg | ORAL_TABLET | Freq: Once | ORAL | Status: AC
  Administered 2024-03-06: 100 mg via ORAL
  Filled 2024-03-06: qty 1

## 2024-03-06 MED ORDER — DOXYCYCLINE HYCLATE 100 MG PO CAPS: 100.0000 mg | ORAL_CAPSULE | Freq: Two times a day (BID) | ORAL | 0 refills | Status: AC

## 2024-03-06 NOTE — Discharge Instructions (Signed)
 It was a pleasure taking part in your care.  As we discussed, I believe you have cellulitis of your right breast.  Please begin taking doxycycline twice a day for the next 7 days.  Please take ibuprofen  or Tylenol  for pain.  Follow-up with your PCP.  Return to the ED with any new symptoms such as fevers, nausea or vomiting.

## 2024-03-06 NOTE — ED Triage Notes (Signed)
 RIGHT BREAST WOUND  X 4 days Red swollen tender, pains in right under arm  Think she was bitten by a insect

## 2024-03-06 NOTE — ED Provider Notes (Signed)
 [place] EMERGENCY DEPARTMENT AT [facility] Provider Note   CSN: [id number] Arrival date & time: 03/06/24  1554     Patient presents with: Wound Check   FATEN FRIESON is a 58 y.o. female with history of anemia, endometrioma of ovary.  Patient presents to ED for evaluation of wounds to right breast.  States that 4 days ago she noticed a area of redness and pain to her right breast which is progressive gotten worse.  She states she feels as if she was bitten by a spider of some sort.  She did not see a spider.  She states that 2 days ago some drainage that appeared to look like pus came from this area but has had no drainage since.  No fevers at home.  No nausea or vomiting.  Denies breast-feeding.  Denies IV drug use.  Denies history of mastitis or abscesses.    Wound Check       Prior to Admission medications   Medication Sig Start Date End Date Taking? Authorizing Provider  acetaminophen  (TYLENOL ) 325 MG tablet Take 650 mg by mouth every 6 (six) hours as needed (for headaches).     [provider]  cephALEXin  (KEFLEX ) 500 MG capsule Take 1 capsule (500 mg total) by mouth 2 (two) times daily. Patient not taking: Reported on 10/18/2017 12/23/15   Carlota Day, MD  ibuprofen  (ADVIL ,MOTRIN ) 800 MG tablet Take 1 tablet (800 mg total) by mouth 3 (three) times daily. 10/24/18   Vicky Charleston, PA-C    Allergies: Patient has no known allergies.    Review of Systems  All other systems reviewed and are negative.   Updated Vital Signs BP (!) 122/98   Pulse 62   Temp 98.2 F (36.8 C) (Oral)   Resp 16   SpO2 98%   Physical Exam Vitals and nursing note reviewed.  Constitutional:      General: She is not in acute distress.    Appearance: She is well-developed.  HENT:     Head: Normocephalic and atraumatic.  Eyes:     Conjunctiva/sclera: Conjunctivae normal.  Cardiovascular:     Rate and Rhythm: Normal rate and regular rhythm.     Heart sounds: No murmur  heard. Pulmonary:     Effort: Pulmonary effort is normal. No respiratory distress.     Breath sounds: Normal breath sounds.  Abdominal:     Palpations: Abdomen is soft.     Tenderness: There is no abdominal tenderness.  Musculoskeletal:        General: No swelling.     Cervical back: Neck supple.  Skin:    General: Skin is warm and dry.     Capillary Refill: Capillary refill takes less than 2 seconds.     Comments: 4 x 3 cm area of induration without fluctuance.  Surrounding erythema.  Neurological:     Mental Status: She is alert and oriented to person, place, and time. Mental status is at baseline.  Psychiatric:        Mood and Affect: Mood normal.     (all labs ordered are listed, but only abnormal results are displayed) Labs Reviewed  BASIC METABOLIC PANEL WITH GFR - Abnormal; Notable for the following components:      Result Value   Glucose, Bld 103 (*)    All other components within normal limits  CBC    EKG: None  Radiology: No results found.   Procedures   Medications Ordered in the ED  doxycycline (  VIBRA-TABS) tablet 100 mg (100 mg Oral Given 03/06/24 1654)    Clinical Course as of 03/06/24 1726  Fri Mar 06, 2024  1648 Wound assessed with ultrasound.  Cobblestoning present however no fluid collection. [CG]    Clinical Course User Index [CG] Ruthell Lonni FALCON, PA-C   Medical Decision Making Amount and/or Complexity of Data Reviewed Labs: ordered.   58 year old female presents for evaluation of redness, swelling to her right breast.  On examination, the patient does have a 4 x 3 cm area of induration without fluctuance.  There is no active drainage.  She is afebrile and nontachycardic.  Her labs were collected and assessed.  Her labs are grossly unremarkable.  No leukocytosis or anemia.  Metabolic panel unremarkable.  Ultrasound imaging was utilized to assess for underlying collection of fluid that needs draining.  Patient ultrasound shows cobblestoning  consistent with cellulitis however there is no obvious fluid collection.  The patient reports that she did have a significant amount of drainage from this area yesterday and she reports that she feels as if all of the fluid was drained from her right breast.  She is overall nontoxic in appearance here with reassuring vital signs.  Patient will be placed on doxycycline which she will take for the next 7 days twice a day.  She was advised to follow-up with her PCP.  Encouraged to return to the ED with any new or worsening symptoms and she voiced understanding.  Stable to discharge.    Final diagnoses:  Cellulitis, unspecified cellulitis site    ED Discharge Orders     None          Ruthell Lonni FALCON DEVONNA 03/06/24 1727    Darra Fonda MATSU, MD 03/06/24 2330
